# Patient Record
Sex: Female | Born: 1960 | Race: White | Hispanic: Refuse to answer | Marital: Married | Smoking: Former smoker
Health system: Southern US, Community
[De-identification: ages and names within clinical notes are randomized; demographics above are authoritative.]

## PROBLEM LIST (undated history)

## (undated) DIAGNOSIS — M8580 Other specified disorders of bone density and structure, unspecified site: Secondary | ICD-10-CM

## (undated) DIAGNOSIS — T8859XA Other complications of anesthesia, initial encounter: Secondary | ICD-10-CM

## (undated) DIAGNOSIS — F419 Anxiety disorder, unspecified: Secondary | ICD-10-CM

## (undated) DIAGNOSIS — IMO0001 Reserved for inherently not codable concepts without codable children: Secondary | ICD-10-CM

## (undated) DIAGNOSIS — L4050 Arthropathic psoriasis, unspecified: Secondary | ICD-10-CM

## (undated) DIAGNOSIS — M069 Rheumatoid arthritis, unspecified: Secondary | ICD-10-CM

## (undated) DIAGNOSIS — R200 Anesthesia of skin: Secondary | ICD-10-CM

## (undated) DIAGNOSIS — Z9889 Other specified postprocedural states: Secondary | ICD-10-CM

## (undated) DIAGNOSIS — K219 Gastro-esophageal reflux disease without esophagitis: Secondary | ICD-10-CM

## (undated) DIAGNOSIS — I38 Endocarditis, valve unspecified: Secondary | ICD-10-CM

## (undated) DIAGNOSIS — T4145XA Adverse effect of unspecified anesthetic, initial encounter: Secondary | ICD-10-CM

## (undated) DIAGNOSIS — R3915 Urgency of urination: Secondary | ICD-10-CM

## (undated) DIAGNOSIS — I10 Essential (primary) hypertension: Secondary | ICD-10-CM

## (undated) DIAGNOSIS — E041 Nontoxic single thyroid nodule: Secondary | ICD-10-CM

## (undated) DIAGNOSIS — F329 Major depressive disorder, single episode, unspecified: Secondary | ICD-10-CM

## (undated) DIAGNOSIS — R112 Nausea with vomiting, unspecified: Secondary | ICD-10-CM

## (undated) DIAGNOSIS — G479 Sleep disorder, unspecified: Secondary | ICD-10-CM

## (undated) DIAGNOSIS — M797 Fibromyalgia: Secondary | ICD-10-CM

## (undated) DIAGNOSIS — M48061 Spinal stenosis, lumbar region without neurogenic claudication: Secondary | ICD-10-CM

## (undated) DIAGNOSIS — F32A Depression, unspecified: Secondary | ICD-10-CM

## (undated) DIAGNOSIS — L409 Psoriasis, unspecified: Secondary | ICD-10-CM

## (undated) DIAGNOSIS — M1990 Unspecified osteoarthritis, unspecified site: Secondary | ICD-10-CM

## (undated) HISTORY — PX: CHOLECYSTECTOMY: SHX55

## (undated) HISTORY — PX: FOOT SURGERY: SHX648

---

## 2008-11-12 HISTORY — PX: ABDOMINAL HYSTERECTOMY: SHX81

## 2014-10-05 ENCOUNTER — Other Ambulatory Visit: Payer: Self-pay | Admitting: Surgical

## 2014-11-01 NOTE — Patient Instructions (Addendum)
Winta Barcelo  11/01/2014   Your procedure is scheduled on: 11/10/14   Report to [facility]  Entrance and follow signs to               Short Stay Center at 8:00 AM.   Call this number if you have problems the morning of surgery [phone]   Remember:  Do not eat food or drink liquids :After Midnight.     Take these medicines the morning of surgery with A SIP OF WATER: PROTONIX                               You may not have any metal on your body including hair pins and              piercings  Do not wear jewelry, make-up, lotions, powders or perfumes.             Do not wear nail polish.  Do not shave  48 hours prior to surgery.              Men may shave face and neck.   Do not bring valuables to the hospital. [facility] IS NOT             RESPONSIBLE   FOR VALUABLES.  Contacts, dentures or bridgework may not be worn into surgery.  Leave suitcase in the car. After surgery it may be brought to your room.     Patients discharged the day of surgery will not be allowed to drive home.  Name and phone number of your driver:  Special Instructions: N/A              Please read over the following fact sheets you were given: _____________________________________________________________________                                                      - PREPARING FOR SURGERY  Before surgery, you can play an important role.  Because skin is not sterile, your skin needs to be as free of germs as possible.  You can reduce the number of germs on your skin by washing with CHG (chlorahexidine gluconate) soap before surgery.  CHG is an antiseptic cleaner which kills germs and bonds with the skin to continue killing germs even after washing. Please DO NOT use if you have an allergy to CHG or antibacterial soaps.  If your skin becomes reddened/irritated stop using the CHG and inform your nurse when you arrive at Short Stay. Do not shave (including legs and  underarms) for at least 48 hours prior to the first CHG shower.  You may shave your face. Please follow these instructions carefully:   1.  Shower with CHG Soap the night before surgery and the  morning of Surgery.   2.  If you choose to wash your hair, wash your hair first as usual with your  normal  Shampoo.   3.  After you shampoo, rinse your hair and body thoroughly to remove the  shampoo.  4.  Use CHG as you would any other liquid soap.  You can apply chg directly  to the skin and wash . Gently wash with scrungie or clean wascloth    5.  Apply the CHG Soap to your body ONLY FROM THE NECK DOWN.   Do not use on open                           Wound or open sores. Avoid contact with eyes, ears mouth and genitals (private parts).                        Genitals (private parts) with your normal soap.              6.  Wash thoroughly, paying special attention to the area where your surgery  will be performed.   7.  Thoroughly rinse your body with warm water from the neck down.   8.  DO NOT shower/wash with your normal soap after using and rinsing off  the CHG Soap .                9.  Pat yourself dry with a clean towel.             10.  Wear clean pajamas.             11.  Place clean sheets on your bed the night of your first shower and do not  sleep with pets.  Day of Surgery : Do not apply any lotions/deodorants the morning of surgery.  Please wear clean clothes to the hospital/surgery center.  FAILURE TO FOLLOW THESE INSTRUCTIONS MAY RESULT IN THE CANCELLATION OF YOUR SURGERY    PATIENT SIGNATURE_________________________________  ______________________________________________________________________     Adam Phenix  An incentive spirometer is a tool that can help keep your lungs clear and active. This tool measures how well you are filling your lungs with each breath. Taking long deep breaths may help reverse or decrease  the chance of developing breathing (pulmonary) problems (especially infection) following:  A long period of time when you are unable to move or be active. BEFORE THE PROCEDURE   If the spirometer includes an indicator to show your best effort, your nurse or respiratory therapist will set it to a desired goal.  If possible, sit up straight or lean slightly forward. Try not to slouch.  Hold the incentive spirometer in an upright position. INSTRUCTIONS FOR USE   Sit on the edge of your bed if possible, or sit up as far as you can in bed or on a chair.  Hold the incentive spirometer in an upright position.  Breathe out normally.  Place the mouthpiece in your mouth and seal your lips tightly around it.  Breathe in slowly and as deeply as possible, raising the piston or the ball toward the top of the column.  Hold your breath for 3-5 seconds or for as long as possible. Allow the piston or ball to fall to the bottom of the column.  Remove the mouthpiece from your mouth and breathe out normally.  Rest for a few seconds and repeat Steps 1 through 7 at least 10 times every 1-2 hours when you are awake. Take your time and take a few normal breaths between deep breaths.  The spirometer may include an indicator to show your best effort. Use the indicator as a goal to work toward during  each repetition.  After each set of 10 deep breaths, practice coughing to be sure your lungs are clear. If you have an incision (the cut made at the time of surgery), support your incision when coughing by placing a pillow or rolled up towels firmly against it. Once you are able to get out of bed, walk around indoors and cough well. You may stop using the incentive spirometer when instructed by your caregiver.  RISKS AND COMPLICATIONS  Take your time so you do not get dizzy or light-headed.  If you are in pain, you may need to take or ask for pain medication before doing incentive spirometry. It is harder to  take a deep breath if you are having pain. AFTER USE  Rest and breathe slowly and easily.  It can be helpful to keep track of a log of your progress. Your caregiver can provide you with a simple table to help with this. If you are using the spirometer at home, follow these instructions: SEEK MEDICAL CARE IF:   You are having difficultly using the spirometer.  You have trouble using the spirometer as often as instructed.  Your pain medication is not giving enough relief while using the spirometer.  You develop fever of 100.5 F (38.1 C) or higher. SEEK IMMEDIATE MEDICAL CARE IF:   You cough up bloody sputum that had not been present before.  You develop fever of 102 F (38.9 C) or greater.  You develop worsening pain at or near the incision site. MAKE SURE YOU:   Understand these instructions.  Will watch your condition.  Will get help right away if you are not doing well or get worse. Document Released: 03/11/2007 Document Revised: 01/21/2012 Document Reviewed: 05/12/2007 Exit[employer] Patient Information 2014 ExitCare, [place].   ________________________________________________________________________  WHAT IS A BLOOD TRANSFUSION? Blood Transfusion Information  A transfusion is the replacement of blood or some of its parts. Blood is made up of multiple cells which provide different functions.  Red blood cells carry oxygen and are used for blood loss replacement.  White blood cells fight against infection.  Platelets control bleeding.  Plasma helps clot blood.  Other blood products are available for specialized needs, such as hemophilia or other clotting disorders. BEFORE THE TRANSFUSION  Who gives blood for transfusions?   Healthy volunteers who are fully evaluated to make sure their blood is safe. This is blood bank blood. Transfusion therapy is the safest it has ever been in the practice of medicine. Before blood is taken from a donor, a complete history is taken to  make sure that person has no history of diseases nor engages in risky social behavior (examples are intravenous drug use or sexual activity with multiple partners). The donor's travel history is screened to minimize risk of transmitting infections, such as malaria. The donated blood is tested for signs of infectious diseases, such as HIV and hepatitis. The blood is then tested to be sure it is compatible with you in order to minimize the chance of a transfusion reaction. If you or a relative donates blood, this is often done in anticipation of surgery and is not appropriate for emergency situations. It takes many days to process the donated blood. RISKS AND COMPLICATIONS Although transfusion therapy is very safe and saves many lives, the main dangers of transfusion include:   Getting an infectious disease.  Developing a transfusion reaction. This is an allergic reaction to something in the blood you were given. Every precaution is taken to prevent  this. The decision to have a blood transfusion has been considered carefully by your caregiver before blood is given. Blood is not given unless the benefits outweigh the risks. AFTER THE TRANSFUSION  Right after receiving a blood transfusion, you will usually feel much better and more energetic. This is especially true if your red blood cells have gotten low (anemic). The transfusion raises the level of the red blood cells which carry oxygen, and this usually causes an energy increase.  The nurse administering the transfusion will monitor you carefully for complications. HOME CARE INSTRUCTIONS  No special instructions are needed after a transfusion. You may find your energy is better. Speak with your caregiver about any limitations on activity for underlying diseases you may have. SEEK MEDICAL CARE IF:   Your condition is not improving after your transfusion.  You develop redness or irritation at the intravenous (IV) site. SEEK IMMEDIATE MEDICAL CARE  IF:  Any of the following symptoms occur over the next 12 hours:  Shaking chills.  You have a temperature by mouth above 102 F (38.9 C), not controlled by medicine.  Chest, back, or muscle pain.  People around you feel you are not acting correctly or are confused.  Shortness of breath or difficulty breathing.  Dizziness and fainting.  You get a rash or develop hives.  You have a decrease in urine output.  Your urine turns a dark color or changes to pink, red, or brown. Any of the following symptoms occur over the next 10 days:  You have a temperature by mouth above 102 F (38.9 C), not controlled by medicine.  Shortness of breath.  Weakness after normal activity.  The white part of the eye turns yellow (jaundice).  You have a decrease in the amount of urine or are urinating less often.  Your urine turns a dark color or changes to pink, red, or brown. Document Released: 10/26/2000 Document Revised: 01/21/2012 Document Reviewed: 06/14/2008 [facility] Patient Information 2014 [place], [place].  _______________________________________________________________________

## 2014-11-02 ENCOUNTER — Ambulatory Visit (HOSPITAL_COMMUNITY)
Admission: RE | Admit: 2014-11-02 | Discharge: 2014-11-02 | Disposition: A | Discharge status: Home / Self Care | Payer: Worker's Compensation | Source: Ambulatory Visit | Attending: Surgical | Admitting: Surgical

## 2014-11-02 ENCOUNTER — Encounter (HOSPITAL_COMMUNITY): Payer: Self-pay

## 2014-11-02 ENCOUNTER — Encounter (HOSPITAL_COMMUNITY)
Admission: RE | Admit: 2014-11-02 | Discharge: 2014-11-02 | Disposition: A | Discharge status: Home / Self Care | Payer: Worker's Compensation | Source: Ambulatory Visit | Attending: Orthopedic Surgery | Admitting: Orthopedic Surgery

## 2014-11-02 DIAGNOSIS — M5137 Other intervertebral disc degeneration, lumbosacral region: Secondary | ICD-10-CM | POA: Insufficient documentation

## 2014-11-02 DIAGNOSIS — Z01818 Encounter for other preprocedural examination: Secondary | ICD-10-CM | POA: Insufficient documentation

## 2014-11-02 HISTORY — DX: Adverse effect of unspecified anesthetic, initial encounter: T41.45XA

## 2014-11-02 HISTORY — DX: Anxiety disorder, unspecified: F41.9

## 2014-11-02 HISTORY — DX: Endocarditis, valve unspecified: I38

## 2014-11-02 HISTORY — DX: Reserved for inherently not codable concepts without codable children: IMO0001

## 2014-11-02 HISTORY — DX: Urgency of urination: R39.15

## 2014-11-02 HISTORY — DX: Other complications of anesthesia, initial encounter: T88.59XA

## 2014-11-02 HISTORY — DX: Arthropathic psoriasis, unspecified: L40.50

## 2014-11-02 HISTORY — DX: Depression, unspecified: F32.A

## 2014-11-02 HISTORY — DX: Spinal stenosis, lumbar region without neurogenic claudication: M48.061

## 2014-11-02 HISTORY — DX: Unspecified osteoarthritis, unspecified site: M19.90

## 2014-11-02 HISTORY — DX: Sleep disorder, unspecified: G47.9

## 2014-11-02 HISTORY — DX: Psoriasis, unspecified: L40.9

## 2014-11-02 HISTORY — DX: Anesthesia of skin: R20.0

## 2014-11-02 HISTORY — DX: Major depressive disorder, single episode, unspecified: F32.9

## 2014-11-02 HISTORY — DX: Nontoxic single thyroid nodule: E04.1

## 2014-11-02 HISTORY — DX: Rheumatoid arthritis, unspecified: M06.9

## 2014-11-02 HISTORY — DX: Fibromyalgia: M79.7

## 2014-11-02 HISTORY — DX: Other specified postprocedural states: Z98.890

## 2014-11-02 HISTORY — DX: Other specified disorders of bone density and structure, unspecified site: M85.80

## 2014-11-02 HISTORY — DX: Gastro-esophageal reflux disease without esophagitis: K21.9

## 2014-11-02 HISTORY — DX: Nausea with vomiting, unspecified: R11.2

## 2014-11-02 HISTORY — DX: Essential (primary) hypertension: I10

## 2014-11-02 LAB — CBC WITH DIFFERENTIAL/PLATELET
Basophils Absolute: 0 10*3/uL (ref 0.0–0.1)
Basophils Relative: 0 % (ref 0–1)
Eosinophils Absolute: 0.5 10*3/uL (ref 0.0–0.7)
Eosinophils Relative: 5 % (ref 0–5)
HCT: 48 % — ABNORMAL HIGH (ref 36.0–46.0)
Hemoglobin: 16 g/dL — ABNORMAL HIGH (ref 12.0–15.0)
Lymphocytes Relative: 27 % (ref 12–46)
Lymphs Abs: 2.8 10*3/uL (ref 0.7–4.0)
MCH: 29 pg (ref 26.0–34.0)
MCHC: 33.3 g/dL (ref 30.0–36.0)
MCV: 87.1 fL (ref 78.0–100.0)
Monocytes Absolute: 0.8 10*3/uL (ref 0.1–1.0)
Monocytes Relative: 8 % (ref 3–12)
Neutro Abs: 6.1 10*3/uL (ref 1.7–7.7)
Neutrophils Relative %: 60 % (ref 43–77)
Platelets: 244 10*3/uL (ref 150–400)
RBC: 5.51 MIL/uL — ABNORMAL HIGH (ref 3.87–5.11)
RDW: 14.2 % (ref 11.5–15.5)
WBC: 10.3 10*3/uL (ref 4.0–10.5)

## 2014-11-02 LAB — APTT: aPTT: 31 seconds (ref 24–37)

## 2014-11-02 LAB — COMPREHENSIVE METABOLIC PANEL
ALT: 64 U/L — ABNORMAL HIGH (ref 0–35)
AST: 39 U/L — ABNORMAL HIGH (ref 0–37)
Albumin: 4.5 g/dL (ref 3.5–5.2)
Alkaline Phosphatase: 67 U/L (ref 39–117)
Anion gap: 9 (ref 5–15)
BUN: 17 mg/dL (ref 6–23)
CO2: 29 mmol/L (ref 19–32)
Calcium: 10.1 mg/dL (ref 8.4–10.5)
Chloride: 105 mEq/L (ref 96–112)
Creatinine, Ser: 0.72 mg/dL (ref 0.50–1.10)
GFR calc Af Amer: >90 mL/min (ref >90)
GFR calc non Af Amer: >90 mL/min (ref >90)
Glucose, Bld: 113 mg/dL — ABNORMAL HIGH (ref 70–99)
Potassium: 4.8 mmol/L (ref 3.5–5.1)
Sodium: 143 mmol/L (ref 135–145)
Total Bilirubin: 0.7 mg/dL (ref 0.3–1.2)
Total Protein: 7.3 g/dL (ref 6.0–8.3)

## 2014-11-02 LAB — URINALYSIS, ROUTINE W REFLEX MICROSCOPIC
Bilirubin Urine: NEGATIVE
Glucose, UA: NEGATIVE mg/dL
Hgb urine dipstick: NEGATIVE
Ketones, ur: NEGATIVE mg/dL
Leukocytes, UA: NEGATIVE
Nitrite: NEGATIVE
Protein, ur: NEGATIVE mg/dL
Specific Gravity, Urine: 1.019 (ref 1.005–1.030)
Urobilinogen, UA: 0.2 mg/dL (ref 0.0–1.0)
pH: 5.5 (ref 5.0–8.0)

## 2014-11-02 LAB — PROTIME-INR
INR: 1.04 (ref 0.00–1.49)
Prothrombin Time: 13.7 seconds (ref 11.6–15.2)

## 2014-11-02 LAB — ABO/RH: ABO/RH(D): O NEG

## 2014-11-02 LAB — SURGICAL PCR SCREEN
MRSA, PCR: NEGATIVE
STAPHYLOCOCCUS AUREUS: NEGATIVE

## 2014-11-02 IMAGING — CR DG LUMBAR SPINE 2-3V
2 series · 2 of 2 positions shown · non-contrast
Comparison: None.

CLINICAL DATA: Preoperative exam prior to back surgery on [DATE].

EXAM:
LUMBAR SPINE - 2-3 VIEW

[t l-spine a.p.]
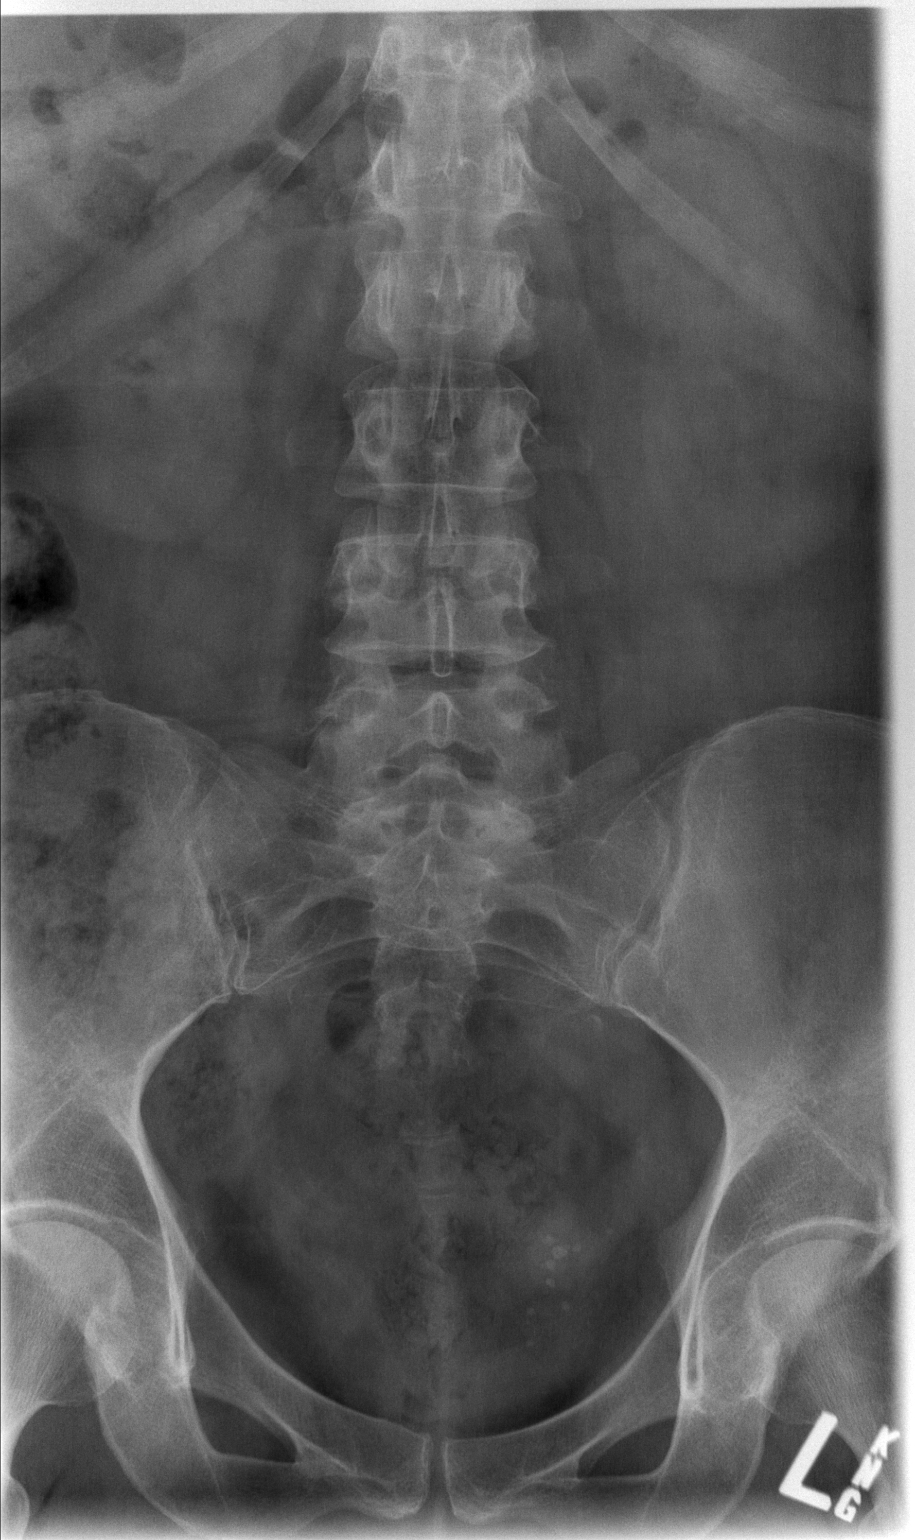

[t l-spine lat]
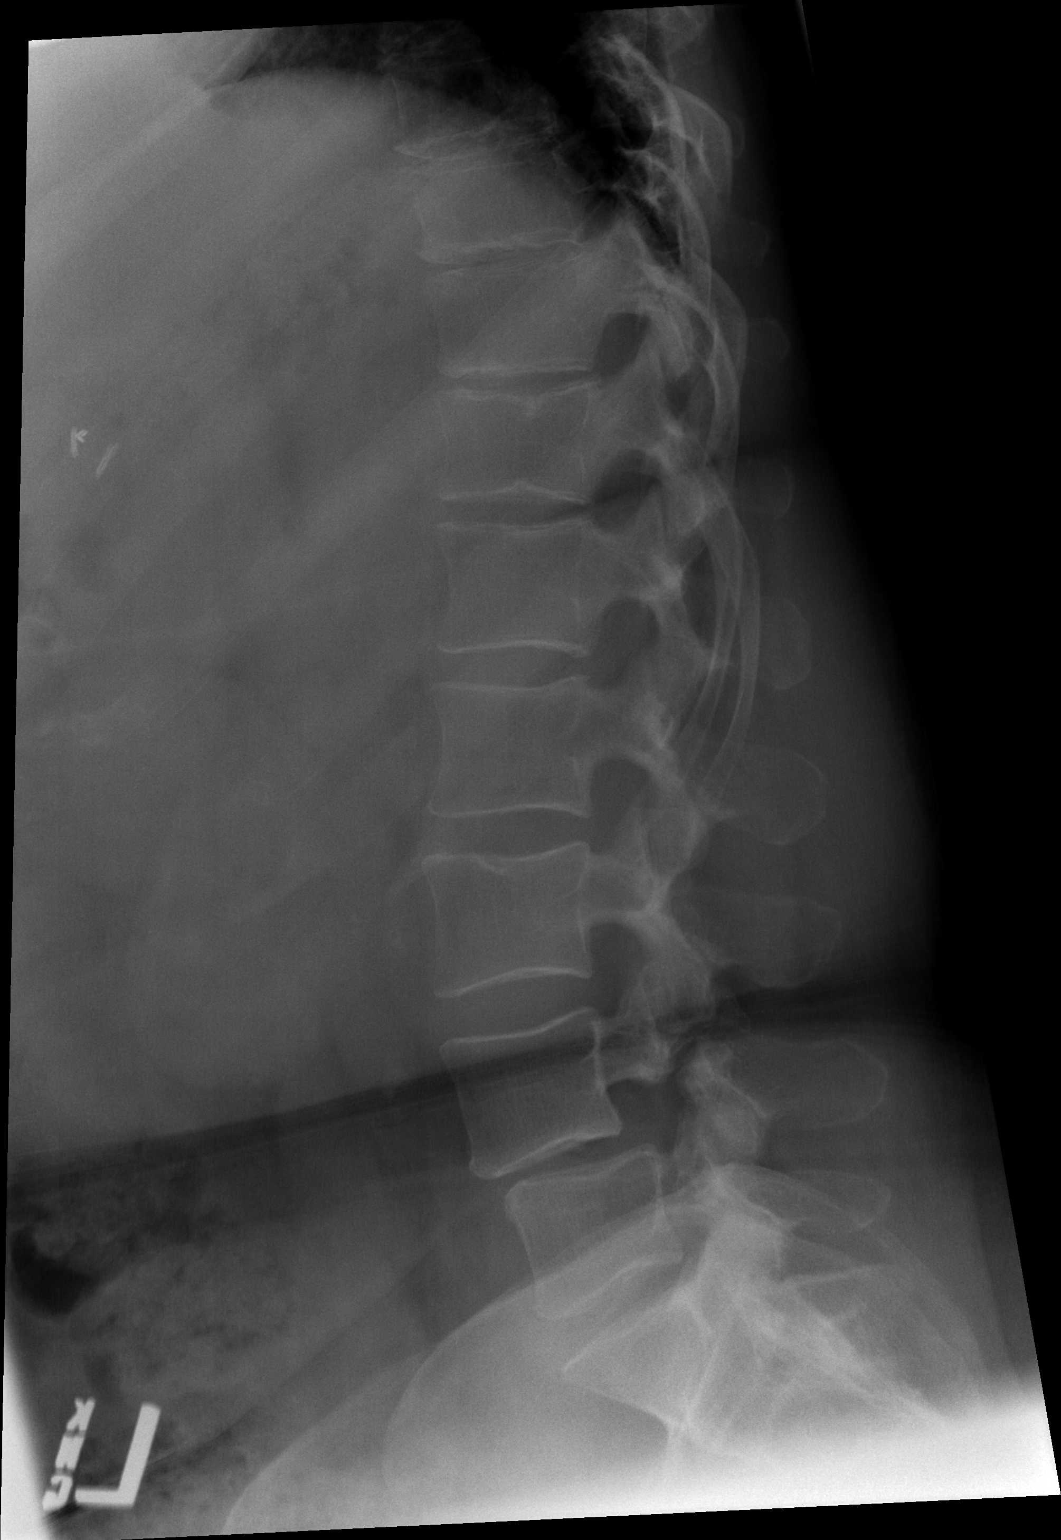

[2 of 2 positions shown; findings below may reference images not displayed]

FINDINGS: The lumbar vertebral bodies are preserved in height. There are
bilateral pars defects at L4. There is grade 1 anterolisthesis of L4
with respect L5. There is moderate disc space narrowing at L4-5 as
well. There is mild facet joint hypertrophy at L5-S1. The pedicles
and transverse processes are intact. The observed portions of the
sacrum are normal.
IMPRESSION: There are bilateral pars defects at L4 with grade 1 anterolisthesis
of L4 with respect L5. There is degenerative disc space narrowing at
L4-5 and bilateral facet joint hypertrophy at L5-S1.

## 2014-11-02 IMAGING — CR DG CHEST 2V
2 series · 2 of 2 positions shown · non-contrast
Comparison: None.

CLINICAL DATA: Preop.  Back surgery.

EXAM:
CHEST  2 VIEW

[w chest pa]
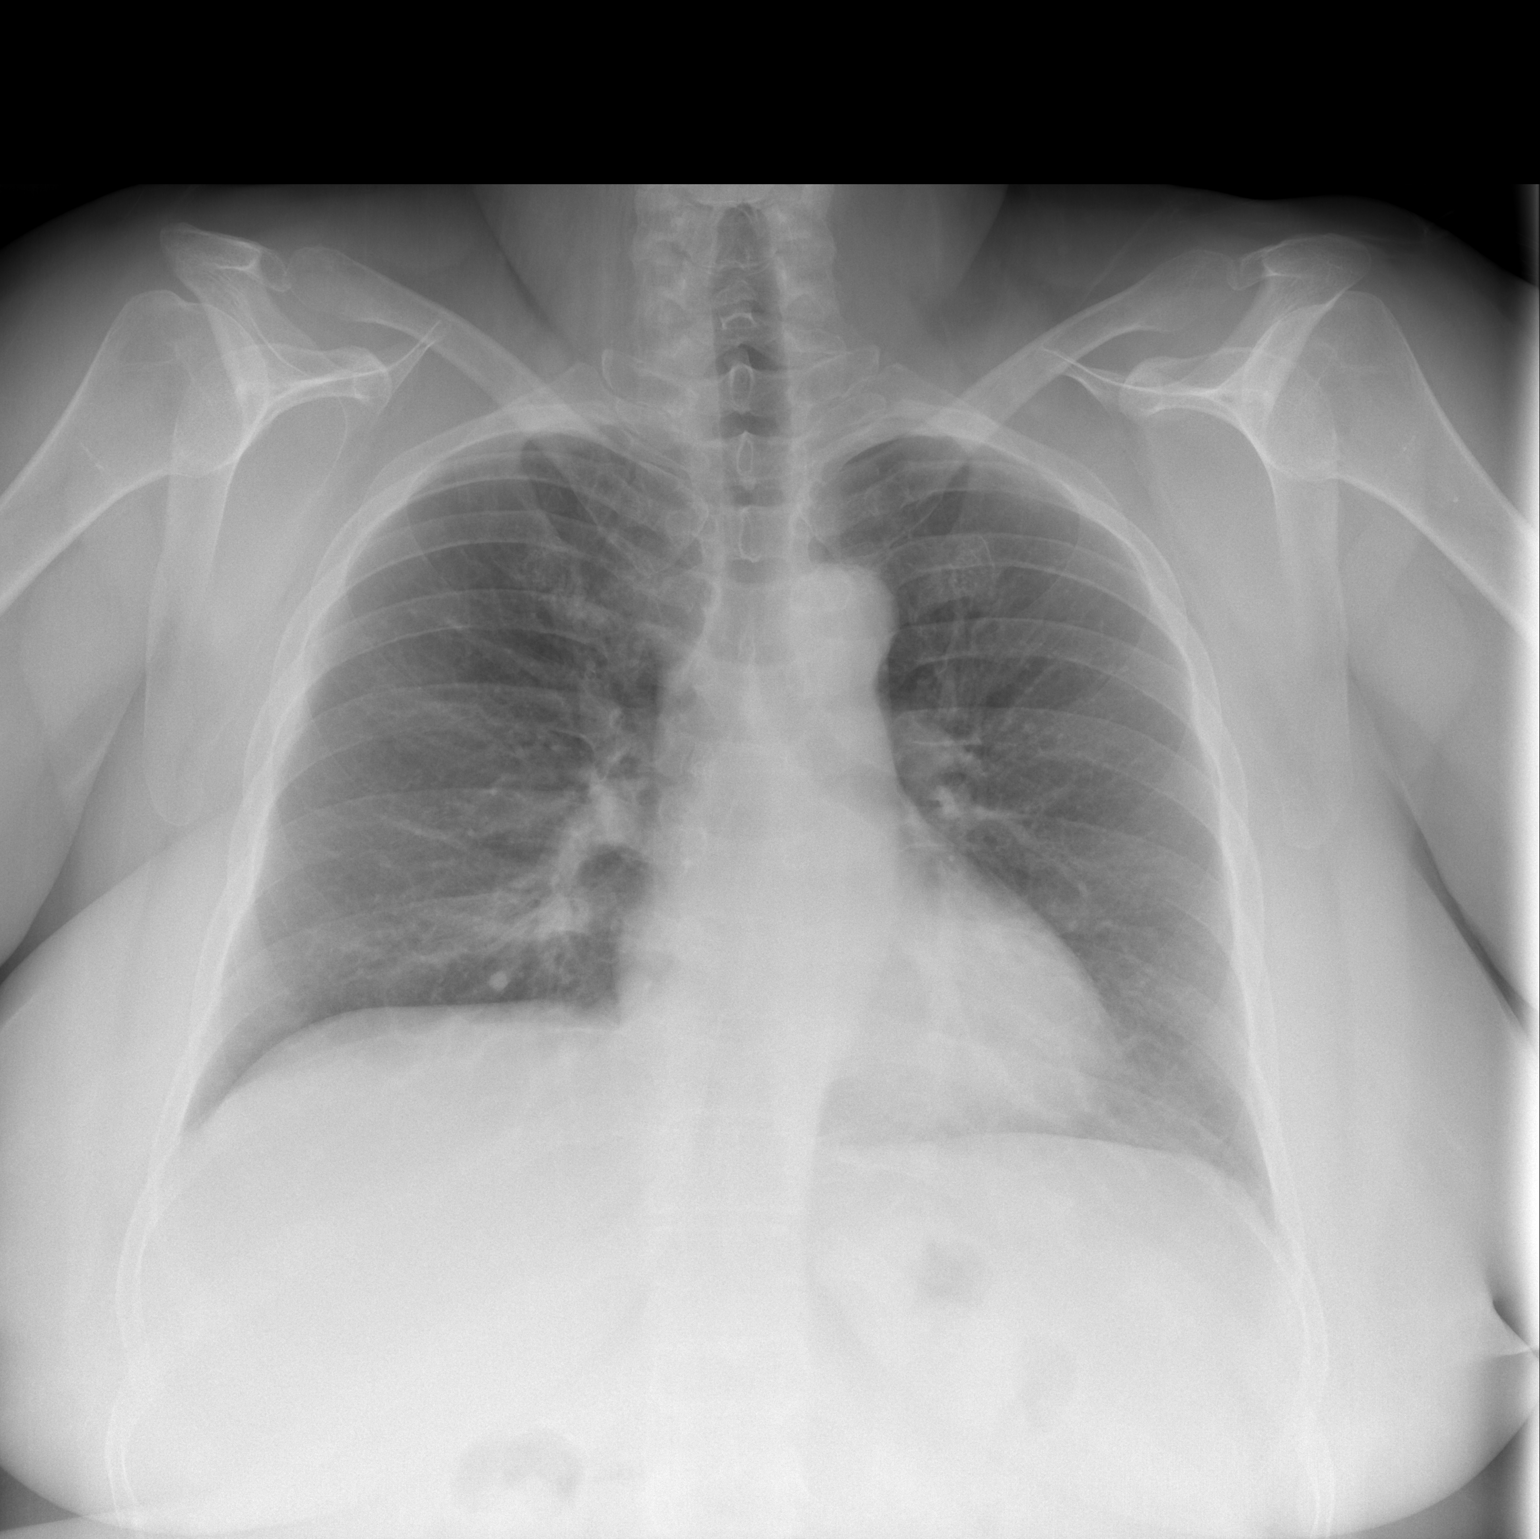

[w chest lat]
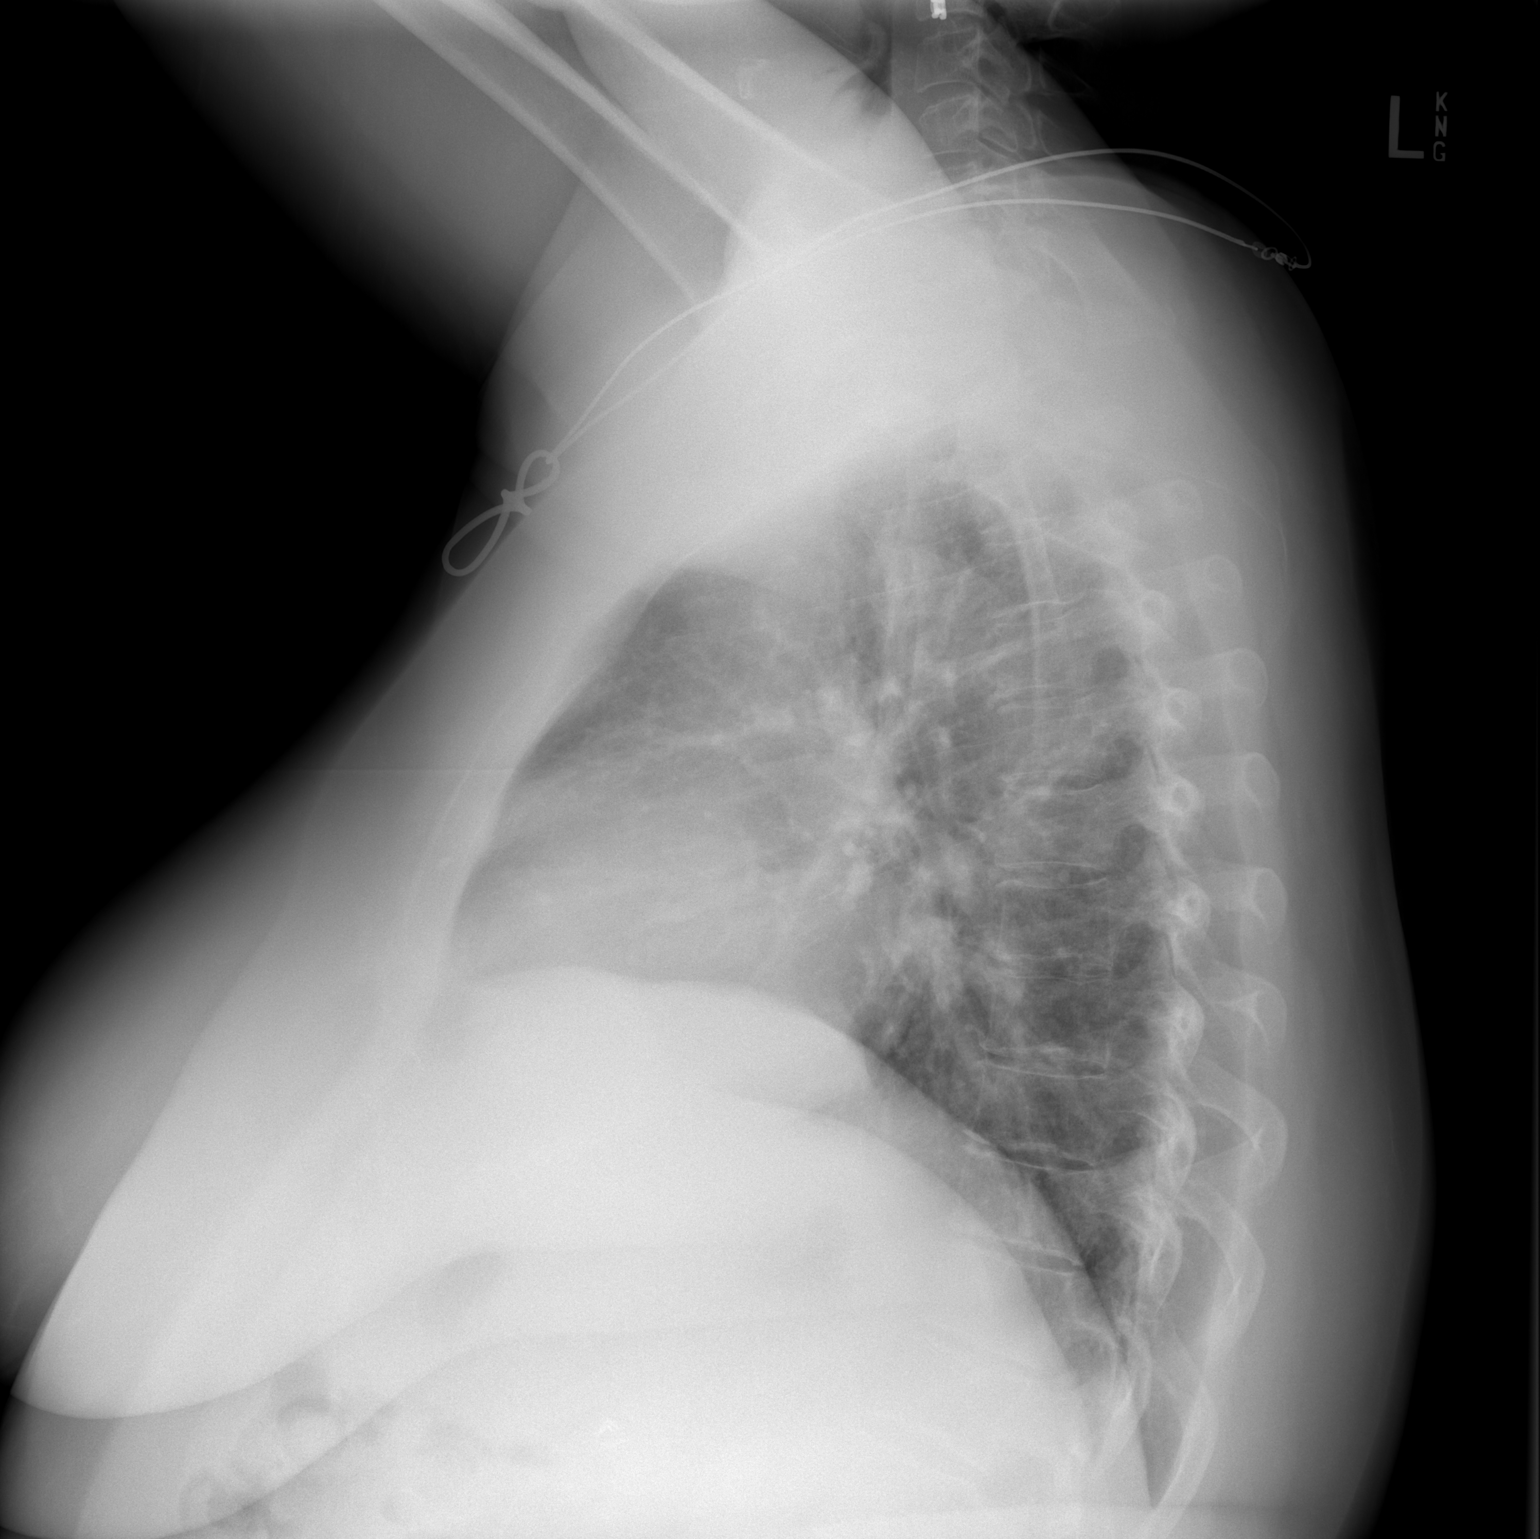

[2 of 2 positions shown; findings below may reference images not displayed]

FINDINGS: Low volumes. Grossly clear. Normal heart size. No pneumothorax. No
pleural effusion.
IMPRESSION: No active cardiopulmonary disease.

## 2014-11-02 NOTE — Progress Notes (Signed)
   11/02/14 1432  OBSTRUCTIVE SLEEP APNEA  Have you ever been diagnosed with sleep apnea through a sleep study? No  Do you snore loudly (loud enough to be heard through closed doors)?  1  Do you often feel tired, fatigued, or sleepy during the daytime? 1  Has anyone observed you stop breathing during your sleep? 0  Do you have, or are you being treated for high blood pressure? 1  BMI more than 35 kg/m2? 1  Age over 51 years old? 1  Neck circumference greater than 40 cm/16 inches? 0  Gender: 0  Obstructive Sleep Apnea Score 5  Score 4 or greater  Results sent to PCP

## 2014-11-10 ENCOUNTER — Inpatient Hospital Stay (HOSPITAL_COMMUNITY): Payer: Worker's Compensation

## 2014-11-10 ENCOUNTER — Encounter (HOSPITAL_COMMUNITY): Payer: Self-pay | Admitting: *Deleted

## 2014-11-10 ENCOUNTER — Inpatient Hospital Stay (HOSPITAL_COMMUNITY): Payer: Worker's Compensation | Admitting: Certified Registered Nurse Anesthetist

## 2014-11-10 ENCOUNTER — Observation Stay (HOSPITAL_COMMUNITY)
Admission: RE | Admit: 2014-11-10 | Discharge: 2014-11-11 | Disposition: A | Discharge status: Home / Self Care | Payer: Worker's Compensation | Source: Ambulatory Visit | Attending: Orthopedic Surgery | Admitting: Orthopedic Surgery

## 2014-11-10 ENCOUNTER — Encounter (HOSPITAL_COMMUNITY)
Admission: RE | Disposition: A | Discharge status: Home / Self Care | Payer: Self-pay | Source: Ambulatory Visit | Attending: Orthopedic Surgery

## 2014-11-10 DIAGNOSIS — M7138 Other bursal cyst, other site: Secondary | ICD-10-CM | POA: Insufficient documentation

## 2014-11-10 DIAGNOSIS — Z885 Allergy status to narcotic agent status: Secondary | ICD-10-CM | POA: Insufficient documentation

## 2014-11-10 DIAGNOSIS — M4806 Spinal stenosis, lumbar region: Secondary | ICD-10-CM | POA: Diagnosis not present

## 2014-11-10 DIAGNOSIS — I10 Essential (primary) hypertension: Secondary | ICD-10-CM | POA: Diagnosis not present

## 2014-11-10 DIAGNOSIS — M5126 Other intervertebral disc displacement, lumbar region: Principal | ICD-10-CM | POA: Insufficient documentation

## 2014-11-10 DIAGNOSIS — F329 Major depressive disorder, single episode, unspecified: Secondary | ICD-10-CM | POA: Diagnosis not present

## 2014-11-10 DIAGNOSIS — F419 Anxiety disorder, unspecified: Secondary | ICD-10-CM | POA: Insufficient documentation

## 2014-11-10 DIAGNOSIS — K219 Gastro-esophageal reflux disease without esophagitis: Secondary | ICD-10-CM | POA: Diagnosis not present

## 2014-11-10 DIAGNOSIS — E041 Nontoxic single thyroid nodule: Secondary | ICD-10-CM | POA: Diagnosis not present

## 2014-11-10 DIAGNOSIS — Z79899 Other long term (current) drug therapy: Secondary | ICD-10-CM | POA: Insufficient documentation

## 2014-11-10 DIAGNOSIS — L4050 Arthropathic psoriasis, unspecified: Secondary | ICD-10-CM | POA: Insufficient documentation

## 2014-11-10 DIAGNOSIS — M797 Fibromyalgia: Secondary | ICD-10-CM | POA: Insufficient documentation

## 2014-11-10 DIAGNOSIS — M069 Rheumatoid arthritis, unspecified: Secondary | ICD-10-CM | POA: Diagnosis not present

## 2014-11-10 DIAGNOSIS — L409 Psoriasis, unspecified: Secondary | ICD-10-CM | POA: Diagnosis not present

## 2014-11-10 DIAGNOSIS — Z419 Encounter for procedure for purposes other than remedying health state, unspecified: Secondary | ICD-10-CM

## 2014-11-10 DIAGNOSIS — M48062 Spinal stenosis, lumbar region with neurogenic claudication: Secondary | ICD-10-CM | POA: Diagnosis present

## 2014-11-10 HISTORY — PX: DECOMPRESSIVE LUMBAR LAMINECTOMY LEVEL 1: SHX5791

## 2014-11-10 LAB — TYPE AND SCREEN
ABO/RH(D): O NEG
Antibody Screen: NEGATIVE

## 2014-11-10 IMAGING — DX DG SPINE 1V PORT
1 series · 1 of 1 positions shown · non-contrast
Comparison: Intraoperative image earlier today.

CLINICAL DATA: Surgical level L4-5.

EXAM:
PORTABLE SPINE - 1 VIEW

[l-spine x-table]
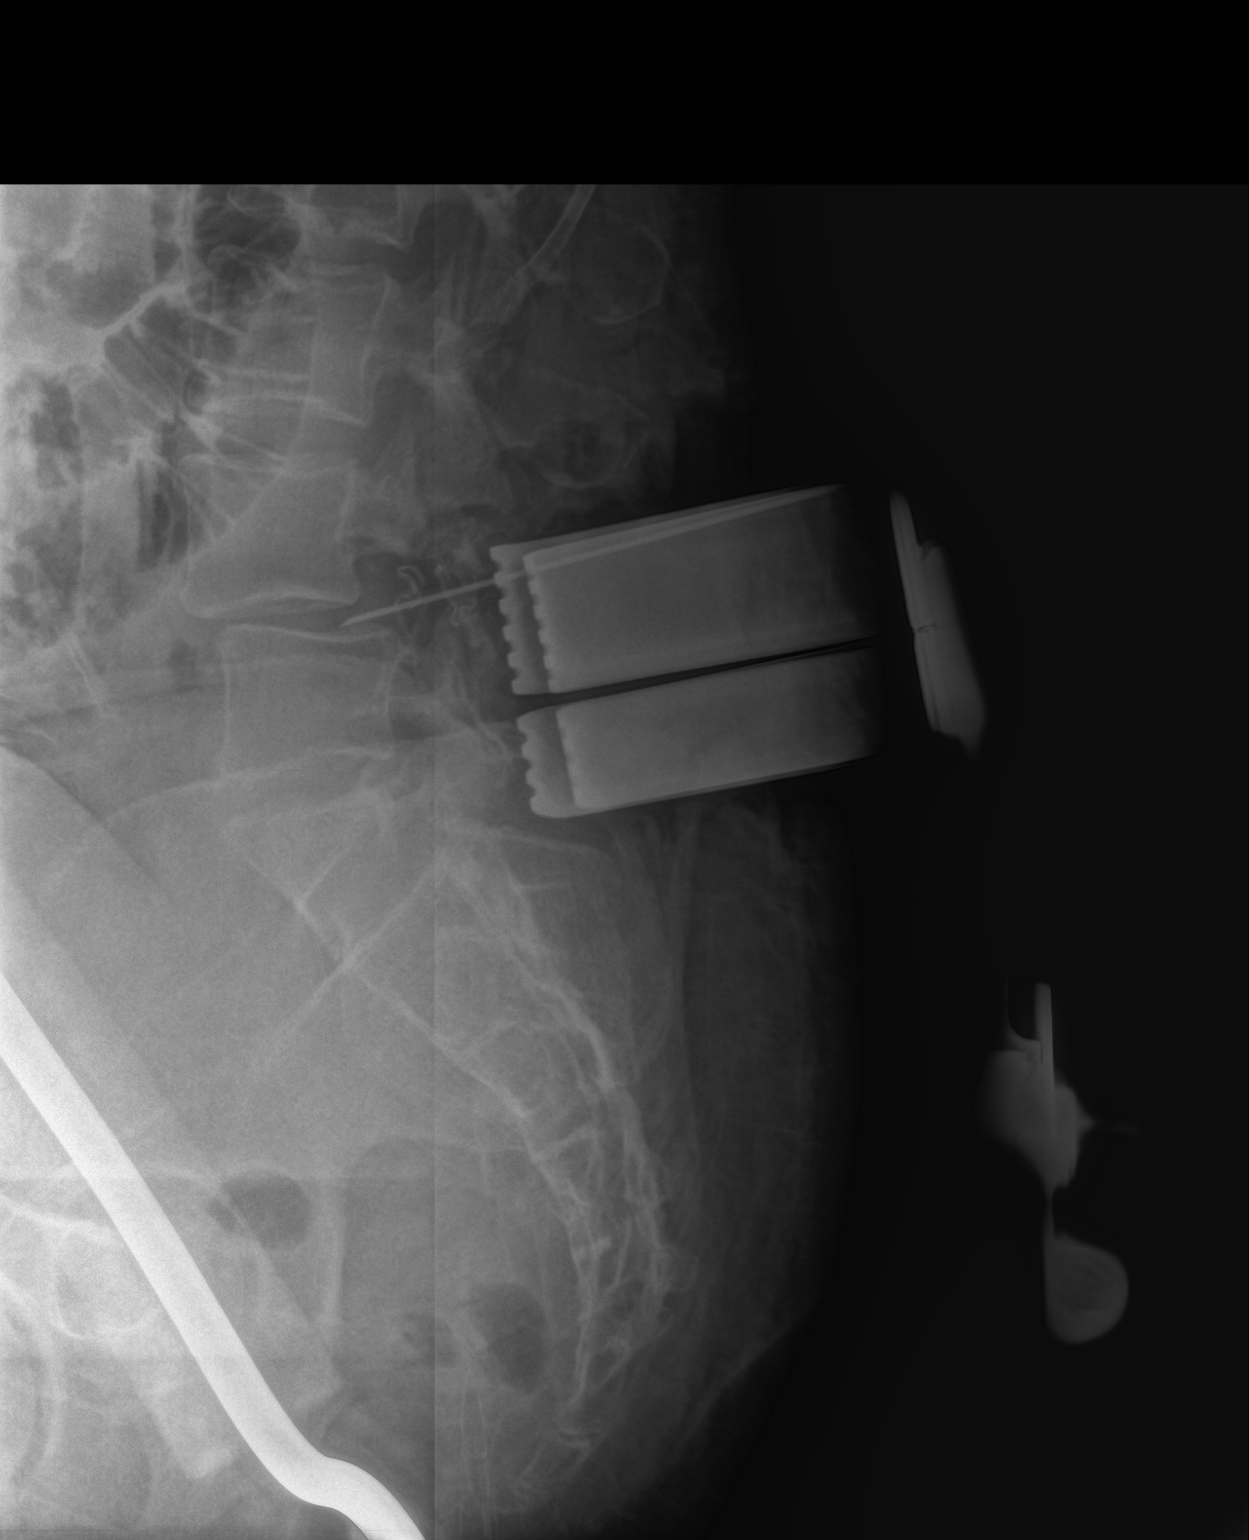

[1 of 1 positions shown; findings below may reference images not displayed]

FINDINGS: Posterior surgical instruments are in place with a localizing
instrument projecting over the L4-5 disc space. Stable mild
anterolisthesis of L4 on L5.
IMPRESSION: Localization of L4-5.

## 2014-11-10 IMAGING — DX DG SPINE 1V PORT
1 series · 1 of 1 positions shown · non-contrast
Comparison: Same day.

CLINICAL DATA: Surgical localization.

EXAM:
PORTABLE SPINE - 1 VIEW

[l-spine x-table]
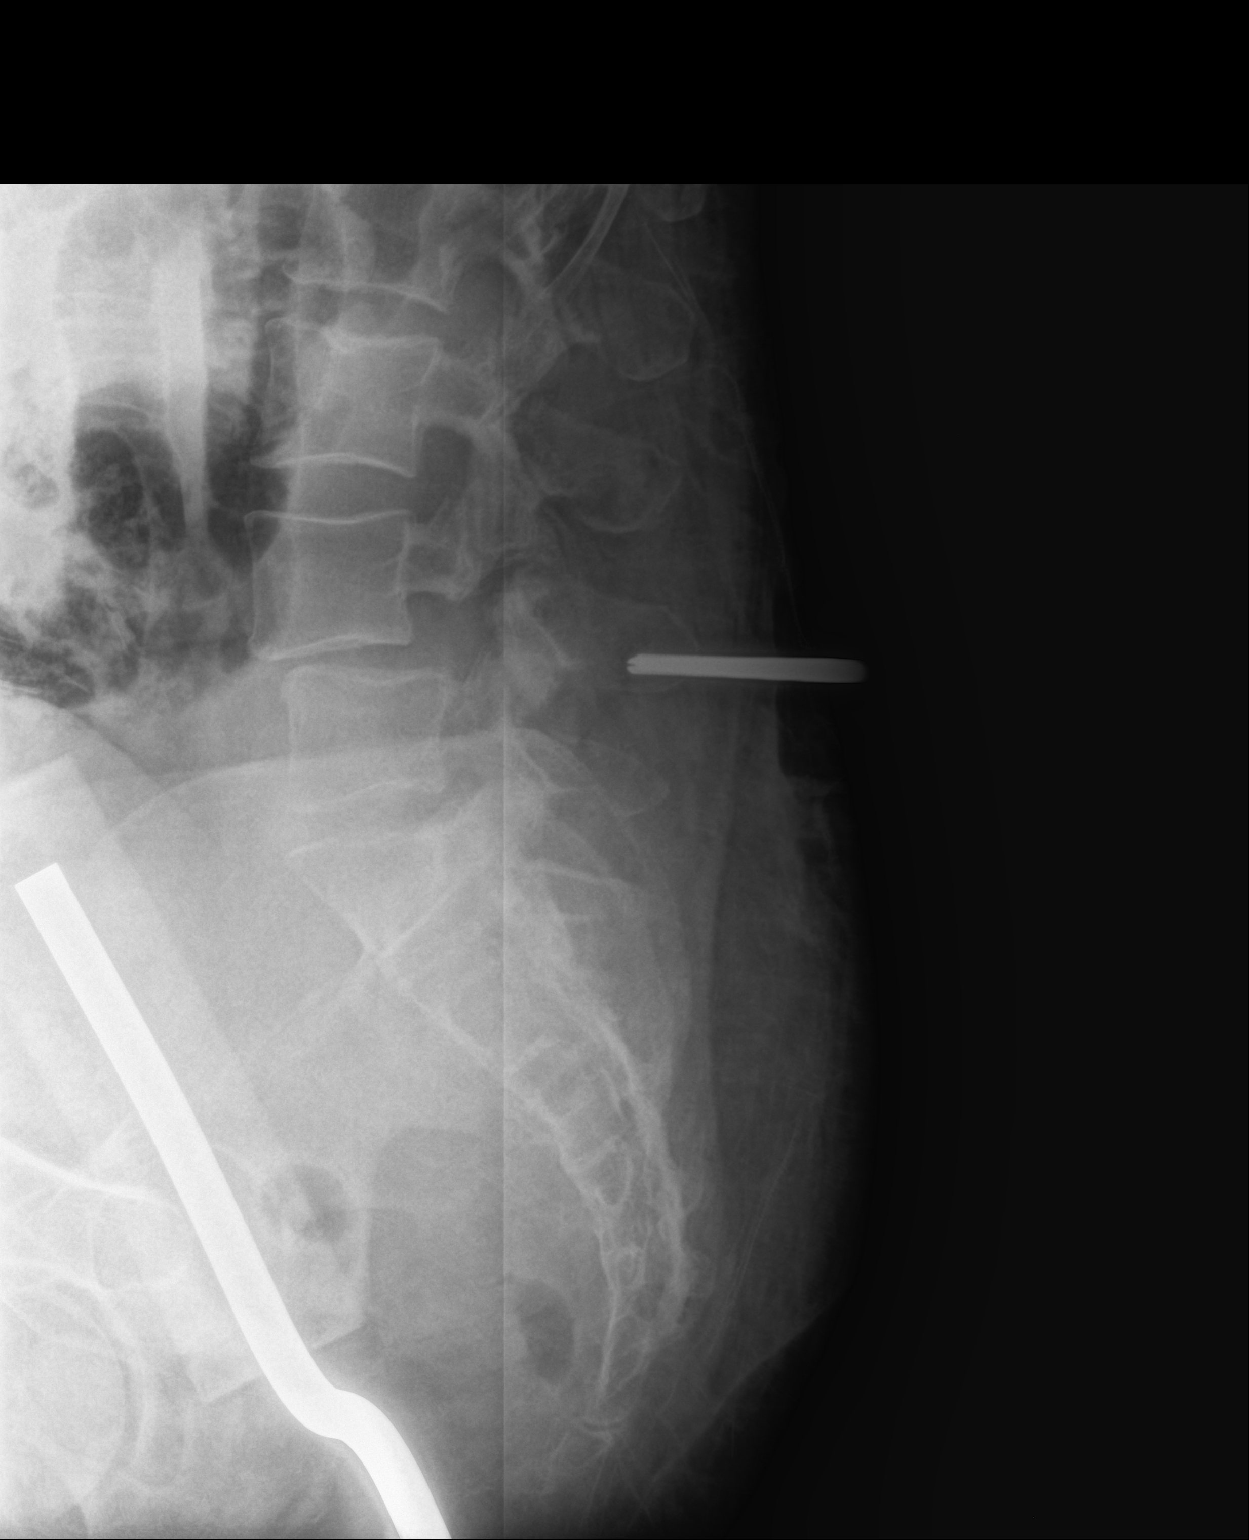

[1 of 1 positions shown; findings below may reference images not displayed]

FINDINGS: Single lateral intraoperative radiograph of lumbar spine was
obtained. Grade 1 anterolisthesis of L4-5 secondary to pars defects
of L4 is again noted. Surgical probe is seen overlying the posterior
spinous process of L4.
IMPRESSION: Surgical localization as described above.

## 2014-11-10 IMAGING — DX DG SPINE 1V PORT
1 series · 1 of 1 positions shown · non-contrast
Comparison: [DATE].

CLINICAL DATA: Surgical localization.

EXAM:
PORTABLE SPINE - 1 VIEW

[l-spine x-table]
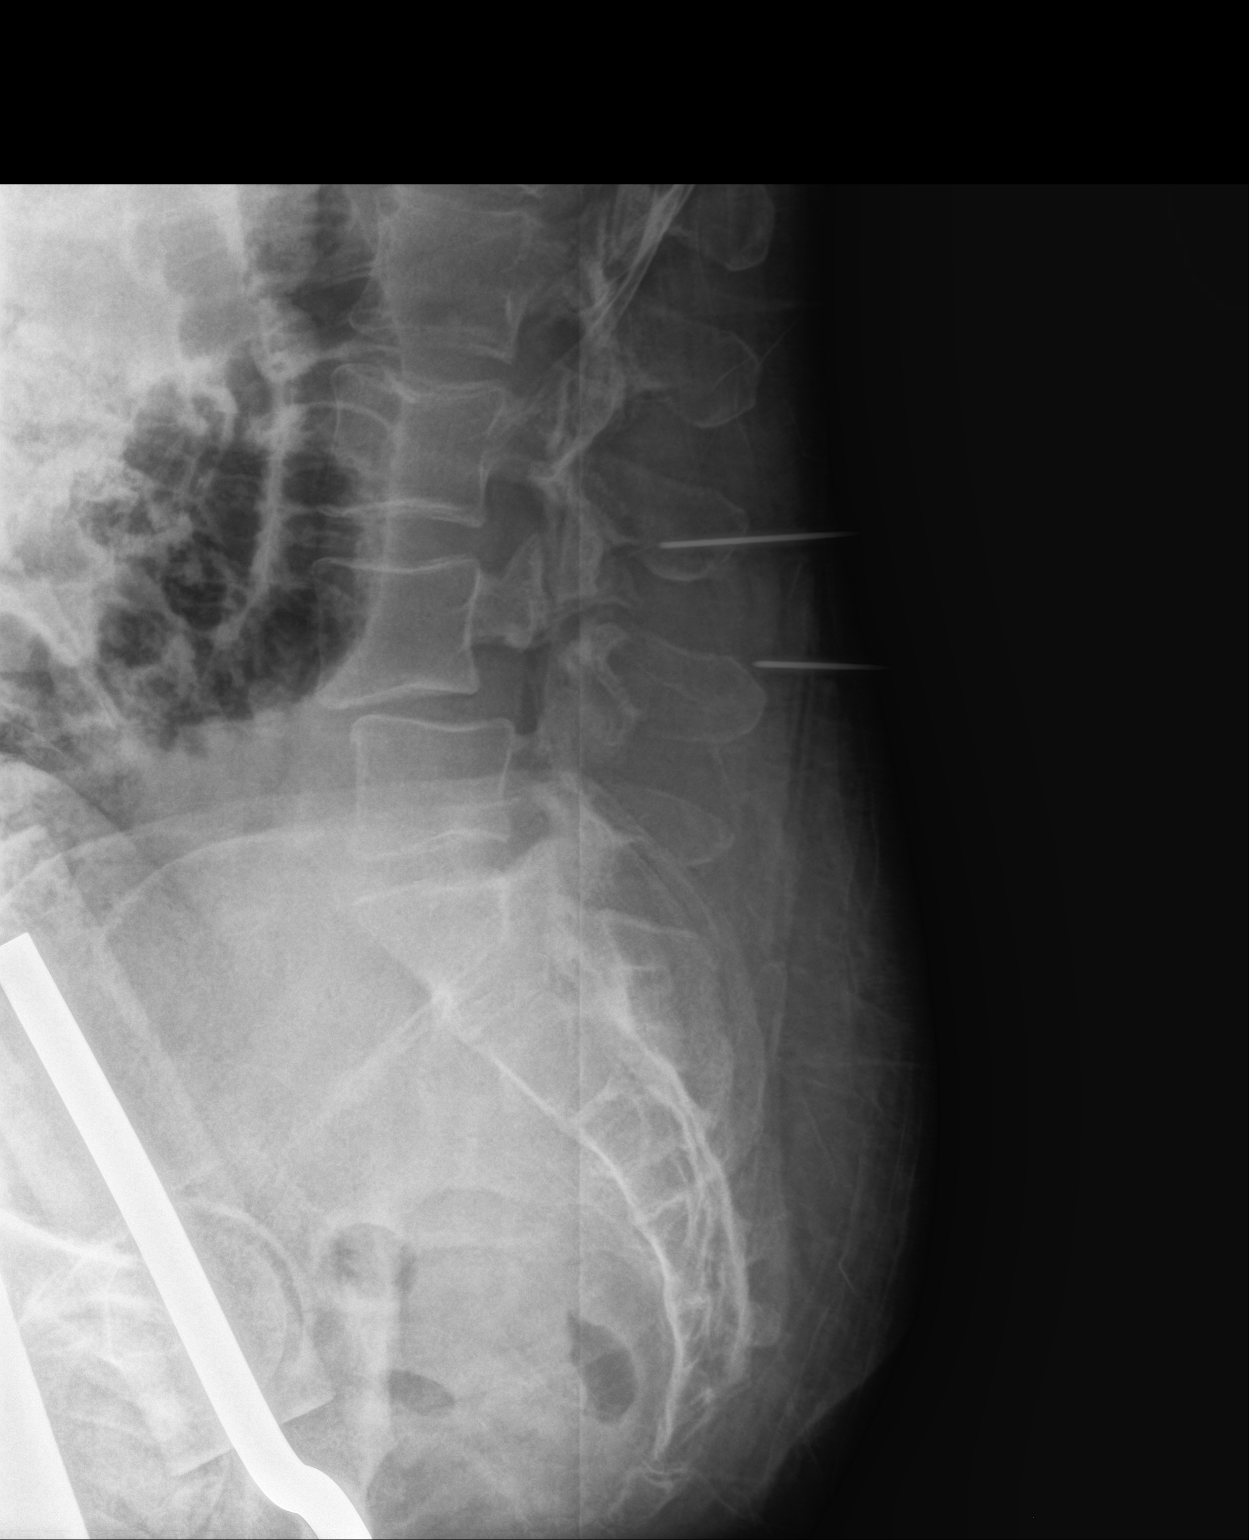

[1 of 1 positions shown; findings below may reference images not displayed]

FINDINGS: Single lateral intraoperative projection of the lumbar spine was
submitted for review. Pars defects are seen at L4 with grade 1
anterolisthesis at L4-5. Surgical probes are seen overlying or
adjacent to the posterior spinous processes of L3 and L4.
IMPRESSION: Surgical localization as described above.

## 2014-11-10 SURGERY — DECOMPRESSIVE LUMBAR LAMINECTOMY LEVEL 1
Anesthesia: General | Site: Back | Laterality: Right

## 2014-11-10 MED ORDER — FENTANYL CITRATE 0.05 MG/ML IJ SOLN
INTRAMUSCULAR | Status: AC
  Filled 2014-11-10: qty 5

## 2014-11-10 MED ORDER — HYDROMORPHONE HCL 1 MG/ML IJ SOLN
INTRAMUSCULAR | Status: AC
  Filled 2014-11-10: qty 1

## 2014-11-10 MED ORDER — BUPIVACAINE-EPINEPHRINE (PF) 0.5% -1:200000 IJ SOLN
INTRAMUSCULAR | Status: AC
  Filled 2014-11-10: qty 30

## 2014-11-10 MED ORDER — LACTATED RINGERS IV SOLN: INTRAVENOUS | Status: DC

## 2014-11-10 MED ORDER — PHENOL 1.4 % MT LIQD: 1.0000 | OROMUCOSAL | Status: DC | PRN

## 2014-11-10 MED ORDER — METHOCARBAMOL 1000 MG/10ML IJ SOLN
500.0000 mg | Freq: Four times a day (QID) | INTRAVENOUS | Status: DC | PRN
  Administered 2014-11-10: 500 mg via INTRAVENOUS
  Filled 2014-11-10 (×2): qty 5

## 2014-11-10 MED ORDER — EPHEDRINE SULFATE 50 MG/ML IJ SOLN
INTRAMUSCULAR | Status: DC | PRN
  Administered 2014-11-10: 5 mg via INTRAVENOUS

## 2014-11-10 MED ORDER — KETOROLAC TROMETHAMINE 30 MG/ML IJ SOLN
30.0000 mg | Freq: Once | INTRAMUSCULAR | Status: AC
  Administered 2014-11-10: 30 mg via INTRAVENOUS

## 2014-11-10 MED ORDER — ONDANSETRON HCL 4 MG/2ML IJ SOLN
4.0000 mg | Freq: Once | INTRAMUSCULAR | Status: AC
  Administered 2014-11-10: 4 mg via INTRAVENOUS

## 2014-11-10 MED ORDER — LISINOPRIL 20 MG PO TABS
20.0000 mg | ORAL_TABLET | Freq: Every day | ORAL | Status: DC
  Administered 2014-11-10: 20 mg via ORAL
  Filled 2014-11-10 (×2): qty 1

## 2014-11-10 MED ORDER — NEOSTIGMINE METHYLSULFATE 10 MG/10ML IV SOLN
INTRAVENOUS | Status: AC
  Filled 2014-11-10: qty 1

## 2014-11-10 MED ORDER — KETOROLAC TROMETHAMINE 30 MG/ML IJ SOLN
INTRAMUSCULAR | Status: AC
  Filled 2014-11-10: qty 1

## 2014-11-10 MED ORDER — PROPOFOL 10 MG/ML IV BOLUS
INTRAVENOUS | Status: AC
  Filled 2014-11-10: qty 20

## 2014-11-10 MED ORDER — CLONAZEPAM 1 MG PO TABS
1.0000 mg | ORAL_TABLET | Freq: Every day | ORAL | Status: DC
  Administered 2014-11-10: 1 mg via ORAL
  Filled 2014-11-10: qty 1

## 2014-11-10 MED ORDER — PANTOPRAZOLE SODIUM 40 MG PO TBEC
40.0000 mg | DELAYED_RELEASE_TABLET | Freq: Every day | ORAL | Status: DC
  Administered 2014-11-11: 40 mg via ORAL
  Filled 2014-11-10: qty 1

## 2014-11-10 MED ORDER — ONDANSETRON HCL 4 MG/2ML IJ SOLN
INTRAMUSCULAR | Status: DC | PRN
  Administered 2014-11-10: 4 mg via INTRAVENOUS

## 2014-11-10 MED ORDER — CEFAZOLIN SODIUM 1-5 GM-% IV SOLN
1.0000 g | Freq: Three times a day (TID) | INTRAVENOUS | Status: AC
  Administered 2014-11-10 – 2014-11-11 (×3): 1 g via INTRAVENOUS
  Filled 2014-11-10 (×3): qty 50

## 2014-11-10 MED ORDER — LACTATED RINGERS IV SOLN
INTRAVENOUS | Status: DC
  Administered 2014-11-10: 1000 mL via INTRAVENOUS
  Administered 2014-11-10: 11:00:00 via INTRAVENOUS

## 2014-11-10 MED ORDER — HYDROMORPHONE HCL 2 MG PO TABS
2.0000 mg | ORAL_TABLET | ORAL | Status: DC | PRN
  Administered 2014-11-10 – 2014-11-11 (×4): 2 mg via ORAL
  Filled 2014-11-10 (×4): qty 1

## 2014-11-10 MED ORDER — BUPIVACAINE LIPOSOME 1.3 % IJ SUSP
20.0000 mL | Freq: Once | INTRAMUSCULAR | Status: AC
  Administered 2014-11-10: 20 mL
  Filled 2014-11-10: qty 20

## 2014-11-10 MED ORDER — POLYETHYLENE GLYCOL 3350 17 G PO PACK: 17.0000 g | PACK | Freq: Every day | ORAL | Status: DC | PRN

## 2014-11-10 MED ORDER — BISACODYL 5 MG PO TBEC: 5.0000 mg | DELAYED_RELEASE_TABLET | Freq: Every day | ORAL | Status: DC | PRN

## 2014-11-10 MED ORDER — MENTHOL 3 MG MT LOZG: 1.0000 | LOZENGE | OROMUCOSAL | Status: DC | PRN

## 2014-11-10 MED ORDER — HYDROMORPHONE HCL 1 MG/ML IJ SOLN: 0.5000 mg | INTRAMUSCULAR | Status: DC | PRN

## 2014-11-10 MED ORDER — MEPERIDINE HCL 50 MG/ML IJ SOLN: 6.2500 mg | INTRAMUSCULAR | Status: DC | PRN

## 2014-11-10 MED ORDER — SODIUM CHLORIDE 0.9 % IR SOLN
Status: AC
  Filled 2014-11-10: qty 1

## 2014-11-10 MED ORDER — BUPIVACAINE HCL (PF) 0.5 % IJ SOLN
INTRAMUSCULAR | Status: DC | PRN
  Administered 2014-11-10: 20 mL

## 2014-11-10 MED ORDER — HYDROMORPHONE HCL 1 MG/ML IJ SOLN
0.2500 mg | INTRAMUSCULAR | Status: DC | PRN
  Administered 2014-11-10 (×4): 0.5 mg via INTRAVENOUS

## 2014-11-10 MED ORDER — BACITRACIN-NEOMYCIN-POLYMYXIN 400-5-5000 EX OINT
TOPICAL_OINTMENT | CUTANEOUS | Status: DC | PRN
  Administered 2014-11-10: 1 via TOPICAL

## 2014-11-10 MED ORDER — ROCURONIUM BROMIDE 100 MG/10ML IV SOLN
INTRAVENOUS | Status: DC | PRN
  Administered 2014-11-10: 10 mg via INTRAVENOUS
  Administered 2014-11-10: 40 mg via INTRAVENOUS
  Administered 2014-11-10: 10 mg via INTRAVENOUS

## 2014-11-10 MED ORDER — GLYCOPYRROLATE 0.2 MG/ML IJ SOLN
INTRAMUSCULAR | Status: AC
  Filled 2014-11-10: qty 3

## 2014-11-10 MED ORDER — ACETAMINOPHEN 650 MG RE SUPP: 650.0000 mg | RECTAL | Status: DC | PRN

## 2014-11-10 MED ORDER — ONDANSETRON HCL 4 MG/2ML IJ SOLN
4.0000 mg | INTRAMUSCULAR | Status: DC | PRN
  Administered 2014-11-10 (×2): 4 mg via INTRAVENOUS
  Filled 2014-11-10 (×2): qty 2

## 2014-11-10 MED ORDER — LIDOCAINE HCL (CARDIAC) 20 MG/ML IV SOLN
INTRAVENOUS | Status: DC | PRN
  Administered 2014-11-10: 100 mg via INTRAVENOUS

## 2014-11-10 MED ORDER — THROMBIN 5000 UNITS EX SOLR
OROMUCOSAL | Status: DC | PRN
  Administered 2014-11-10: 11:00:00 via TOPICAL

## 2014-11-10 MED ORDER — PROPOFOL 10 MG/ML IV BOLUS
INTRAVENOUS | Status: DC | PRN
  Administered 2014-11-10: 200 mg via INTRAVENOUS

## 2014-11-10 MED ORDER — THROMBIN 5000 UNITS EX SOLR
CUTANEOUS | Status: AC
  Filled 2014-11-10: qty 10000

## 2014-11-10 MED ORDER — HYDROMORPHONE HCL 1 MG/ML IJ SOLN
0.2500 mg | INTRAMUSCULAR | Status: DC | PRN
  Administered 2014-11-10 (×2): 0.5 mg via INTRAVENOUS

## 2014-11-10 MED ORDER — HYDROMORPHONE HCL 2 MG PO TABS: 2.0000 mg | ORAL_TABLET | ORAL | Status: AC | PRN

## 2014-11-10 MED ORDER — MIDAZOLAM HCL 2 MG/2ML IJ SOLN
INTRAMUSCULAR | Status: AC
  Filled 2014-11-10: qty 2

## 2014-11-10 MED ORDER — ACETAMINOPHEN 325 MG PO TABS
650.0000 mg | ORAL_TABLET | ORAL | Status: DC | PRN
  Administered 2014-11-10 – 2014-11-11 (×2): 650 mg via ORAL
  Filled 2014-11-10 (×2): qty 2

## 2014-11-10 MED ORDER — BACITRACIN-NEOMYCIN-POLYMYXIN 400-5-5000 EX OINT
TOPICAL_OINTMENT | CUTANEOUS | Status: AC
  Filled 2014-11-10: qty 1

## 2014-11-10 MED ORDER — NEOSTIGMINE METHYLSULFATE 10 MG/10ML IV SOLN
INTRAVENOUS | Status: DC | PRN
  Administered 2014-11-10: 4 mg via INTRAVENOUS

## 2014-11-10 MED ORDER — MIDAZOLAM HCL 5 MG/5ML IJ SOLN
INTRAMUSCULAR | Status: DC | PRN
  Administered 2014-11-10: 2 mg via INTRAVENOUS

## 2014-11-10 MED ORDER — ONDANSETRON HCL 4 MG/2ML IJ SOLN
INTRAMUSCULAR | Status: AC
  Filled 2014-11-10: qty 2

## 2014-11-10 MED ORDER — PROMETHAZINE HCL 25 MG/ML IJ SOLN: 6.2500 mg | INTRAMUSCULAR | Status: DC | PRN

## 2014-11-10 MED ORDER — CEFAZOLIN SODIUM-DEXTROSE 2-3 GM-% IV SOLR
2.0000 g | INTRAVENOUS | Status: AC
  Administered 2014-11-10: 2 g via INTRAVENOUS

## 2014-11-10 MED ORDER — LACTATED RINGERS IV SOLN
INTRAVENOUS | Status: DC
  Administered 2014-11-10 – 2014-11-11 (×2): via INTRAVENOUS

## 2014-11-10 MED ORDER — LIDOCAINE HCL (CARDIAC) 20 MG/ML IV SOLN
INTRAVENOUS | Status: AC
  Filled 2014-11-10: qty 5

## 2014-11-10 MED ORDER — GLYCOPYRROLATE 0.2 MG/ML IJ SOLN
INTRAMUSCULAR | Status: DC | PRN
  Administered 2014-11-10: .7 mg via INTRAVENOUS

## 2014-11-10 MED ORDER — FENTANYL CITRATE 0.05 MG/ML IJ SOLN
INTRAMUSCULAR | Status: DC | PRN
  Administered 2014-11-10 (×5): 50 ug via INTRAVENOUS

## 2014-11-10 MED ORDER — CITALOPRAM HYDROBROMIDE 40 MG PO TABS
40.0000 mg | ORAL_TABLET | Freq: Every day | ORAL | Status: DC
  Administered 2014-11-10: 40 mg via ORAL
  Filled 2014-11-10 (×2): qty 1

## 2014-11-10 MED ORDER — ROCURONIUM BROMIDE 100 MG/10ML IV SOLN
INTRAVENOUS | Status: AC
  Filled 2014-11-10: qty 1

## 2014-11-10 MED ORDER — METHOCARBAMOL 500 MG PO TABS: 500.0000 mg | ORAL_TABLET | Freq: Four times a day (QID) | ORAL | Status: AC | PRN

## 2014-11-10 MED ORDER — DEXAMETHASONE SODIUM PHOSPHATE 10 MG/ML IJ SOLN
INTRAMUSCULAR | Status: DC | PRN
  Administered 2014-11-10: 10 mg via INTRAVENOUS

## 2014-11-10 MED ORDER — SUCCINYLCHOLINE CHLORIDE 20 MG/ML IJ SOLN
INTRAMUSCULAR | Status: DC | PRN
  Administered 2014-11-10: 120 mg via INTRAVENOUS

## 2014-11-10 MED ORDER — METHOCARBAMOL 500 MG PO TABS: 500.0000 mg | ORAL_TABLET | Freq: Four times a day (QID) | ORAL | Status: DC | PRN

## 2014-11-10 MED ORDER — CEFAZOLIN SODIUM-DEXTROSE 2-3 GM-% IV SOLR
INTRAVENOUS | Status: AC
  Filled 2014-11-10: qty 50

## 2014-11-10 MED ORDER — SODIUM CHLORIDE 0.9 % IR SOLN
Status: DC | PRN
  Administered 2014-11-10: 500 mL

## 2014-11-10 SURGICAL SUPPLY — 40 items
BAG ZIPLOCK 12X15 (MISCELLANEOUS) ×3 IMPLANT
BENZOIN TINCTURE PRP APPL 2/3 (GAUZE/BANDAGES/DRESSINGS) ×3 IMPLANT
CLEANER TIP ELECTROSURG 2X2 (MISCELLANEOUS) ×3 IMPLANT
DRAIN PENROSE 18X1/4 LTX STRL (WOUND CARE) IMPLANT
DRAPE MICROSCOPE LEICA (MISCELLANEOUS) ×3 IMPLANT
DRAPE POUCH INSTRU U-SHP 10X18 (DRAPES) ×3 IMPLANT
DRAPE SURG 17X11 SM STRL (DRAPES) ×3 IMPLANT
DRSG ADAPTIC 3X8 NADH LF (GAUZE/BANDAGES/DRESSINGS) ×3 IMPLANT
DRSG PAD ABDOMINAL 8X10 ST (GAUZE/BANDAGES/DRESSINGS) ×9 IMPLANT
DURAPREP 26ML APPLICATOR (WOUND CARE) ×3 IMPLANT
ELECT REM PT RETURN 9FT ADLT (ELECTROSURGICAL) ×3
ELECTRODE REM PT RTRN 9FT ADLT (ELECTROSURGICAL) ×1 IMPLANT
GAUZE SPONGE 4X4 12PLY STRL (GAUZE/BANDAGES/DRESSINGS) ×3 IMPLANT
GLOVE BIOGEL PI IND STRL 8 (GLOVE) ×1 IMPLANT
GLOVE BIOGEL PI INDICATOR 8 (GLOVE) ×2
GLOVE ECLIPSE 8.0 STRL XLNG CF (GLOVE) ×6 IMPLANT
GOWN STRL REUS W/TWL LRG LVL3 (GOWN DISPOSABLE) ×9 IMPLANT
GOWN STRL REUS W/TWL XL LVL3 (GOWN DISPOSABLE) ×6 IMPLANT
KIT BASIN OR (CUSTOM PROCEDURE TRAY) ×3 IMPLANT
KIT POSITIONING SURG ANDREWS (MISCELLANEOUS) ×3 IMPLANT
MANIFOLD NEPTUNE II (INSTRUMENTS) ×3 IMPLANT
NEEDLE SPNL 18GX3.5 QUINCKE PK (NEEDLE) ×6 IMPLANT
NS IRRIG 1000ML POUR BTL (IV SOLUTION) ×3 IMPLANT
PACK LAMINECTOMY ORTHO (CUSTOM PROCEDURE TRAY) ×3 IMPLANT
PATTIES SURGICAL .5 X.5 (GAUZE/BANDAGES/DRESSINGS) IMPLANT
PATTIES SURGICAL .75X.75 (GAUZE/BANDAGES/DRESSINGS) IMPLANT
PATTIES SURGICAL 1X1 (DISPOSABLE) IMPLANT
PIN SAFETY NICK PLATE  2 MED (MISCELLANEOUS)
PIN SAFETY NICK PLATE 2 MED (MISCELLANEOUS) IMPLANT
POSITIONER SURGICAL ARM (MISCELLANEOUS) ×3 IMPLANT
SPONGE LAP 4X18 X RAY DECT (DISPOSABLE) ×3 IMPLANT
SPONGE SURGIFOAM ABS GEL 100 (HEMOSTASIS) ×3 IMPLANT
STAPLER VISISTAT 35W (STAPLE) IMPLANT
SUT VIC AB 0 CT1 27 (SUTURE) ×2
SUT VIC AB 0 CT1 27XBRD ANTBC (SUTURE) ×1 IMPLANT
SUT VIC AB 1 CT1 27 (SUTURE) ×8
SUT VIC AB 1 CT1 27XBRD ANTBC (SUTURE) ×4 IMPLANT
TAPE CLOTH SURG 6X10 WHT LF (GAUZE/BANDAGES/DRESSINGS) ×3 IMPLANT
TOWEL OR 17X26 10 PK STRL BLUE (TOWEL DISPOSABLE) ×6 IMPLANT
WATER STERILE IRR 1500ML POUR (IV SOLUTION) ×3 IMPLANT

## 2014-11-10 NOTE — Anesthesia Preprocedure Evaluation (Addendum)
Anesthesia Evaluation  Patient identified by MRN, date of birth, ID band Patient awake    Reviewed: Allergy & Precautions, H&P , NPO status , Patient's Chart, lab work & pertinent test results  History of Anesthesia Complications (+) PONV and history of anesthetic complications  Airway Mallampati: II  TM Distance: >3 FB Neck ROM: Full    Dental no notable dental hx.    Pulmonary shortness of breath, former smoker,  breath sounds clear to auscultation  Pulmonary exam normal       Cardiovascular hypertension, Pt. on medications Rhythm:Regular Rate:Normal     Neuro/Psych PSYCHIATRIC DISORDERS Anxiety Depression negative neurological ROS     GI/Hepatic Neg liver ROS, GERD-  ,  Endo/Other  Morbid obesity  Renal/GU negative Renal ROS     Musculoskeletal negative musculoskeletal ROS (+) Arthritis -, Fibromyalgia -  Abdominal   Peds  Hematology negative hematology ROS (+)   Anesthesia Other Findings   Reproductive/Obstetrics negative OB ROS                            Anesthesia Physical Anesthesia Plan  ASA: III  Anesthesia Plan: General   Post-op Pain Management:    Induction: Intravenous  Airway Management Planned: Oral ETT  Additional Equipment: None  Intra-op Plan:   Post-operative Plan: Extubation in OR  Informed Consent: I have reviewed the patients History and Physical, chart, labs and discussed the procedure including the risks, benefits and alternatives for the proposed anesthesia with the patient or authorized representative who has indicated his/her understanding and acceptance.   Dental advisory given  Plan Discussed with: CRNA  Anesthesia Plan Comments:         Anesthesia Quick Evaluation

## 2014-11-10 NOTE — Brief Op Note (Signed)
11/10/2014  11:45 AM  PATIENT:  Rhonda Moreno  53 y.o. 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 Decompression for Spinal Stenosis at L-4-L-5  SURGEON:  Surgeon(s) and Role:    * Jacki Cones, MD - Primary    * Javier Docker, MD - Assisting  :   ASSISTANTS: Jene Every MD  ANESTHESIA:   general  EBL:  Total I/O In: 1000 [I.V.:1000] Out: -   BLOOD ADMINISTERED:none  DRAINS: none   LOCAL MEDICATIONS USED:  MARCAINE 20cc of 0.50% with Epinephrine at start of Case and 20cc of Exparel at the end of the case.     SPECIMEN:  Source of Specimen:  L-4-L-5  DISPOSITION OF SPECIMEN:  PATHOLOGY  COUNTS:  YES  TOURNIQUET:  * No tourniquets in log *  DICTATION: .Other Dictation: Dictation Number [id number]  PLAN OF CARE: Admit for overnight observation  PATIENT DISPOSITION:  PACU - hemodynamically stable.   Delay start of Pharmacological VTE agent (>24hrs) due to surgical blood loss or risk of bleeding: yes

## 2014-11-10 NOTE — H&P (Signed)
Rhonda Moreno is an 53 y.o. female.   Chief Complaint: Pain in both Legs. HPI: She states that she injured her back lifting a heavy object at work.MRI showe a HNP,Lumbar.  Past Medical History  Diagnosis Date  . Complication of anesthesia     NAUSEA  . PONV (postoperative nausea and vomiting)     NAUSEA  . Hypertension   . Shortness of breath dyspnea     OCCASIONALLLY WITH EXERTION  . Leaky heart valve     "2 VALVES" NO TREATMENT NEEDED AT THIS TIME  - Followed by [facility] - notes in chart  . Numbness in both legs     worse in rt leg  . Arthritis   . Rheumatoid arthritis   . Psoriatic arthritis   . Spinal stenosis at L4-L5 level   . Fibromyalgia   . Psoriasis   . GERD (gastroesophageal reflux disease)   . Urgency of urination   . Thyroid nodule   . Difficulty sleeping   . Depression   . Anxiety   . Osteopenia     Past Surgical History  Procedure Laterality Date  . Cholecystectomy    . Abdominal hysterectomy  2010  . Cesarean section      X2  . Foot surgery      LEFT    History reviewed. No pertinent family history. Social History:  reports that she quit smoking about 5 years ago. She does not have any smokeless tobacco history on file. She reports that she does not drink alcohol or use illicit drugs.  Allergies:  Allergies  Allergen Reactions  . Codeine     Makes her mean  . Morphine And Related     Makes her mean    Medications Prior to Admission  Medication Sig Dispense Refill  . Calcium Carbonate-Vitamin D (CALCIUM + D PO) Take 1 tablet by mouth daily.    . citalopram (CELEXA) 40 MG tablet Take 40 mg by mouth at bedtime.    . clonazePAM (KLONOPIN) 1 MG tablet Take 1 mg by mouth at bedtime.    Marland Kitchen etodolac (LODINE) 400 MG tablet Take 400 mg by mouth 2 (two) times daily.    Marland Kitchen HYDROmorphone (DILAUDID) 2 MG tablet Take 2 mg by mouth every 6 (six) hours as needed for moderate pain or severe pain.    Marland Kitchen lisinopril (PRINIVIL,ZESTRIL) 20 MG tablet Take 20  mg by mouth at bedtime.    . pantoprazole (PROTONIX) 40 MG tablet Take 40 mg by mouth daily.    Marland Kitchen Ustekinumab (STELARA) 90 MG/ML SOSY Inject into the skin every 3 (three) months.      No results found for this or any previous visit (from the past 48 hour(s)). No results found.  Review of Systems  Constitutional: Negative.   HENT: Negative.   Eyes: Negative.   Respiratory: Negative.   Cardiovascular: Negative.   Gastrointestinal: Negative.   Genitourinary: Negative.   Musculoskeletal: Positive for back pain.  Skin: Negative.   Neurological: Positive for focal weakness.  Endo/Heme/Allergies: Negative.   Psychiatric/Behavioral: Negative.     Blood pressure 153/98, pulse 97, temperature 97.5 F (36.4 C), temperature source Oral, resp. rate 16, height 5\' 2"  (1.575 m), weight 105.688 kg (233 lb), SpO2 98 %. Physical Exam  Constitutional: She appears well-developed.  HENT:  Head: Normocephalic.  Eyes: Pupils are equal, round, and reactive to light.  Neck: Normal range of motion.  Cardiovascular: Normal rate.   Respiratory: Effort normal.  GI: Soft.  Musculoskeletal:  Weakness of dorsiflexors of both feet.  Skin: Skin is warm.     Assessment/Plan Decompressive Lumbar Laminectomy and Microdiscectomy at L-4-L-5 on the Right.  Rhonda Moreno A 11/10/2014, 9:15 AM

## 2014-11-10 NOTE — Progress Notes (Signed)
PACU note-----pt c/o numbness to both her feet; pt can move feet and has good strength to feet but states they are numb; feet are warm with +2 dorsalis pedal pulses; Dr. Darrelyn Hillock and Dimitri Ped, PA made aware, in to check pt, and states it is OK and Dr. Darrelyn Hillock will continue to assess pt regarding numbness throughout the day

## 2014-11-10 NOTE — Transfer of Care (Signed)
Immediate Anesthesia Transfer of Care Note  Patient: Rhonda Moreno  Procedure(s) Performed: Procedure(s) (LRB): CENTRAL DECOMPRESSIVE LUMBAR LAMINECTOMY LEVEL 1 AND MICRODEISCECTOMY L4-5 ON RIGHT WITH EXCISION OF SYNOVIAL CYST OF L4-5 ON RIGHT (Right)  Patient Location: PACU  Anesthesia Type: General  Level of Consciousness: sedated, patient cooperative and responds to stimulation  Airway & Oxygen Therapy: Patient Spontanous Breathing and Patient connected to face mask oxgen  Post-op Assessment: Report given to PACU RN and Post -op Vital signs reviewed and stable  Post vital signs: Reviewed and stable  Complications: No apparent anesthesia complications

## 2014-11-10 NOTE — Anesthesia Postprocedure Evaluation (Signed)
Anesthesia Post Note  Patient: Rhonda Moreno  Procedure(s) Performed: Procedure(s) (LRB): CENTRAL DECOMPRESSIVE LUMBAR LAMINECTOMY LEVEL 1 AND MICRODEISCECTOMY L4-5 ON RIGHT WITH EXCISION OF SYNOVIAL CYST OF L4-5 ON RIGHT (Right)  Anesthesia type: General  Patient location: PACU  Post pain: Pain level controlled  Post assessment: Post-op Vital signs reviewed  Last Vitals: BP 110/57 mmHg  Pulse 99  Temp(Src) 36.7 C (Oral)  Resp 14  Ht 5\' 2"  (1.575 m)  Wt 233 lb (105.688 kg)  BMI 42.61 kg/m2  SpO2 94%  Post vital signs: Reviewed  Level of consciousness: sedated  Complications: No apparent anesthesia complications

## 2014-11-10 NOTE — Discharge Instructions (Signed)
Change your dressing daily. °Shower only, no tub bath.  °For the first few days, remove your dressing, tape a piece of saran wrap over your incision. °Take your shower, then remove the saran wrap and put a clean dressing on. °After three days you can shower without the saran wrap.  °Call if any temperatures greater than 101 or any wound complications: [phone] during the day and ask for Dr. Gioffre's nurse, Tammy Johnson. °

## 2014-11-10 NOTE — Interval H&P Note (Signed)
History and Physical Interval Note:  11/10/2014 9:20 AM  Rhonda Moreno  has presented today for surgery, with the diagnosis of SPINAL STENOSIS  The various methods of treatment have been discussed with the patient and family. After consideration of risks, benefits and other options for treatment, the patient has consented to  Procedure(s): CENTRAL DECOMPRESSIVE LUMBAR LAMINECTOMY LEVEL 1 AND MICRODEISCECTOMY L4-5 ON RIGHT WITH EXCISION OF SYNOVIAL CYST OF L4-5 ON RIGHT (Right) as a surgical intervention .  The patient's history has been reviewed, patient examined, no change in status, stable for surgery.  I have reviewed the patient's chart and labs.  Questions were answered to the patient's satisfaction.     [name]

## 2014-11-10 NOTE — Progress Notes (Signed)
PACU note-----pt c/o pain level 8 continuing; states med not helping; pt has been tearful; Dr. Renold Don called and OK to give pt additional pain meds

## 2014-11-11 ENCOUNTER — Encounter (HOSPITAL_COMMUNITY): Payer: Self-pay | Admitting: Orthopedic Surgery

## 2014-11-11 DIAGNOSIS — M5126 Other intervertebral disc displacement, lumbar region: Secondary | ICD-10-CM | POA: Diagnosis not present

## 2014-11-11 NOTE — Evaluation (Signed)
Physical Therapy Evaluation Patient Details Name: Rhonda Moreno MRN: [id number] DOB: 06-18-1961 Today's Date: 11/11/2014   History of Present Illness  CENTRAL DECOMPRESSIVE LUMBAR LAMINECTOMY LEVEL 1 AND MICRODEISCECTOMY L4-5 ON RIGHT WITH EXCISION OF SYNOVIAL CYST OF L4-5 ON RIGHT   Clinical Impression  Pt s/p lumbar laminectomy presents with post op pain and back precautions limiting functional mobility.  Pt plans d.c home with family assist.  No immediate PT needs but pt would benefit from use of RW at home.     Follow Up Recommendations No PT follow up    Equipment Recommendations  Rolling walker with 5" wheels    Recommendations for Other Services OT consult     Precautions / Restrictions Precautions Precautions: Back Precaution Booklet Issued: Yes (comment) Precaution Comments: All precautions reviewed multiple times Restrictions Weight Bearing Restrictions: No      Mobility  Bed Mobility Overal bed mobility: Needs Assistance Bed Mobility: Rolling;Supine to Sit Rolling: Min guard   Supine to sit: Min guard     General bed mobility comments: cues for log roll and adherence to back precautions  Transfers Overall transfer level: Needs assistance Equipment used: Rolling walker (2 wheeled) Transfers: Sit to/from Stand Sit to Stand: Supervision         General transfer comment: VC for hand placement and safety  Ambulation/Gait Ambulation/Gait assistance: Min guard;Supervision Ambulation Distance (Feet): 200 Feet Assistive device: Rolling walker (2 wheeled) Gait Pattern/deviations: Step-through pattern;Decreased step length - right;Decreased step length - left;Shuffle Gait velocity: decr Gait velocity interpretation: Below normal speed for age/gender General Gait Details: min cues for posture and position from RW  Stairs Stairs: Yes Stairs assistance: Min guard Stair Management: One rail Right Number of Stairs: 3 General stair comments: cues for  sequence  Wheelchair Mobility    Modified Rankin (Stroke Patients Only)       Balance                                             Pertinent Vitals/Pain Pain Assessment: 0-10 Pain Score: 5  Pain Location: back Pain Descriptors / Indicators: Aching Pain Intervention(s): Limited activity within patient's tolerance;Monitored during session;Premedicated before session    Home Living Family/patient expects to be discharged to:: Private residence Living Arrangements: Spouse/significant other Available Help at Discharge: Family Type of Home: House Home Access: Stairs to enter Entrance Stairs-Rails: Right Entrance Stairs-Number of Steps: 10 Home Layout: One level;Able to live on main level with bedroom/bathroom Home Equipment: None      Prior Function Level of Independence: Independent               Hand Dominance        Extremity/Trunk Assessment   Upper Extremity Assessment: Overall WFL for tasks assessed           Lower Extremity Assessment: Overall WFL for tasks assessed         Communication   Communication: No difficulties  Cognition Arousal/Alertness: Awake/alert Behavior During Therapy: WFL for tasks assessed/performed Overall Cognitive Status: Within Functional Limits for tasks assessed                      General Comments      Exercises        Assessment/Plan    PT Assessment Patient needs continued PT services  PT Diagnosis Difficulty walking   PT Problem List  Decreased activity tolerance;Decreased mobility;Decreased knowledge of use of DME;Pain;Decreased knowledge of precautions  PT Treatment Interventions DME instruction;Gait training;Stair training;Functional mobility training;Therapeutic activities;Patient/family education   PT Goals (Current goals can be found in the Care Plan section) Acute Rehab PT Goals Patient Stated Goal: go home today PT Goal Formulation: All assessment and education complete,  DC therapy Time For Goal Achievement: 11/12/14 Potential to Achieve Goals: Good    Frequency Min 6X/week   Barriers to discharge        Co-evaluation               End of Session   Activity Tolerance: Patient tolerated treatment well Patient left: in chair;with call bell/phone within reach Nurse Communication: Mobility status    Functional Assessment Tool Used: clinical judgement Functional Limitation: Mobility: Walking and moving around Mobility: Walking and Moving Around Current Status (X1062): At least 1 percent but less than 20 percent impaired, limited or restricted Mobility: Walking and Moving Around Goal Status [phone]): At least 1 percent but less than 20 percent impaired, limited or restricted Mobility: Walking and Moving Around Discharge Status [phone]): At least 1 percent but less than 20 percent impaired, limited or restricted    Time: 1320-1405 PT Time Calculation (min) (ACUTE ONLY): 45 min   Charges:   PT Evaluation $Initial PT Evaluation Tier I: 1 Procedure PT Treatments $Gait Training: 8-22 mins   PT G Codes:   PT G-Codes **NOT FOR INPATIENT CLASS** Functional Assessment Tool Used: clinical judgement Functional Limitation: Mobility: Walking and moving around Mobility: Walking and Moving Around Current Status (J5009): At least 1 percent but less than 20 percent impaired, limited or restricted Mobility: Walking and Moving Around Goal Status [phone]): At least 1 percent but less than 20 percent impaired, limited or restricted Mobility: Walking and Moving Around Discharge Status [phone]): At least 1 percent but less than 20 percent impaired, limited or restricted    Gwendolyn Mclees 11/11/2014, 2:25 PM

## 2014-11-11 NOTE — Progress Notes (Signed)
Subjective: 1 Day Post-Op Procedure(s) (LRB): CENTRAL DECOMPRESSIVE LUMBAR LAMINECTOMY LEVEL 1 AND MICRODEISCECTOMY L4-5 ON RIGHT WITH EXCISION OF SYNOVIAL CYST OF L4-5 ON RIGHT (Right) Patient reports pain as 2 on 0-10 scale.Doing very well. Up with PT then DC.    Objective: Vital signs in last 24 hours: Temp:  [97.5 F (36.4 C)-98.3 F (36.8 C)] 98 F (36.7 C) (12/31 0502) Pulse Rate:  [86-105] 93 (12/31 0502) Resp:  [12-17] 16 (12/31 0502) BP: (95-153)/(57-98) 95/65 mmHg (12/31 0502) SpO2:  [92 %-98 %] 92 % (12/31 0502) Weight:  [105.688 kg (233 lb)] 105.688 kg (233 lb) (12/30 0807)  Intake/Output from previous day: 12/30 0701 - 12/31 0700 In: 4064.7 [P.O.:1080; I.V.:2884.7; IV Piggyback:100] Out: 1675 [Urine:1625; Blood:50] Intake/Output this shift:    No results for input(s): HGB in the last 72 hours. No results for input(s): WBC, RBC, HCT, PLT in the last 72 hours. No results for input(s): NA, K, CL, CO2, BUN, CREATININE, GLUCOSE, CALCIUM in the last 72 hours. No results for input(s): LABPT, INR in the last 72 hours.  Dorsiflexion/Plantar flexion intact  Assessment/Plan: 1 Day Post-Op Procedure(s) (LRB): CENTRAL DECOMPRESSIVE LUMBAR LAMINECTOMY LEVEL 1 AND MICRODEISCECTOMY L4-5 ON RIGHT WITH EXCISION OF SYNOVIAL CYST OF L4-5 ON RIGHT (Right) Discharge home with home health  Rhonda Moreno A 11/11/2014, 7:24 AM

## 2014-11-11 NOTE — Op Note (Signed)
Rhonda Moreno, Rhonda Moreno NO.:  [id number]  MEDICAL RECORD NO.:  [id number]  LOCATION:  1608                         FACILITY:  [facility]  PHYSICIAN:  Georges Lynch. Tyshell Ramberg, M.D.DATE OF BIRTH:  1961/07/04  DATE OF PROCEDURE:  11/10/2014 DATE OF DISCHARGE:                              OPERATIVE REPORT   SURGEON:  Georges Lynch. Darrelyn Hillock, MD.  ASSISTANT:  Jene Every, MD.  PREOPERATIVE DIAGNOSES: 1. 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 2. Large herniated disc at L4-5, central to the right. 3. Synovial cyst at L4-5 on the right. 4. Spinal stenosis at L4-5.  POSTOPERATIVE DIAGNOSES: 1. 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 2. Large herniated disc at L4-5, central to the right. 3. Synovial cyst at L4-5 on the right. 4. Spinal stenosis at L4-5.  OPERATION: 1. Central decompressive lumbar laminectomy at L4-5 for spinal     stenosis. 2. Microdiscectomy at L4-5 on the right. 3. Excision of synovial cyst at 4-5 on the right. 4. Foraminotomy for the L4 and L5 roots on the right.  DESCRIPTION OF PROCEDURE:  Under general anesthesia with the patient on a spinal frame, routine orthopedic prep and draping of the lower back was carried out.  Appropriate time-out was first carried out.  I also marked the appropriate right side of the back even though we went central.  I marked her in the holding area.  The patient had 2 g of IV Ancef.  At this time, 2 needles were placed in the back for localization purposes.  X-ray was taken.  We then made an incision over the L4-5 space.  I extended it proximally and distally in the usual fashion.  I then cauterized the bleeders.  At this time, I stripped the muscle from the lamina spinous process bilaterally.  I then placed a Kocher clamp on the spinous process of L4.  An x-ray was taken.  Once we identified the space, I did a central decompressive lumbar laminectomy at L4-5.  Note, the lamina was quite loose because of her pars defects  bilaterally. After decompressing her, we went proximally and distally to make sure we had a good decompression.  We went laterally as well on both sides. Following that, we then went over to the right side.  We preserved the facets.  We sized the ligamentum flavum after the microscope was brought in.  We had a nice decompression of the dura.  We protected the dura, identified the disc space, and a needle was placed in the disc space, and an x-ray was taken.  Following that, we cauterized the lateral recess veins.  We had significant amount of venous bleeding from these recess veins.  We had them under good control though.  At this time, I loosely applied some thrombin-soaked Gelfoam distally and proximally over the recess, and we made a cruciate incision in the posterior longitudinal ligament and did a microdiscectomy.  The nerve roots now on 4 and 5 were completely free.  The dura was free.  We thoroughly irrigated out the area, and loosely applied some thrombin-soaked Gelfoam.  Then closed the wound in layers in usual fashion.  Prior to closing the subcu, I injected 20 mL of Exparel into  the soft tissue. Also, prior to doing the surgery, I injected 20 mL of 0.5% Marcaine with epinephrine into the subcu and muscle to prevent bleeding during the incision.  After the wound was closed, sterile dressings were applied. The patient left the operating room in satisfactory condition.          ______________________________ Georges Lynch Darrelyn Hillock, M.D.     RAG/MEDQ  D:  11/10/2014  T:  11/10/2014  Job:  [id number]

## 2014-11-11 NOTE — Progress Notes (Signed)
UR completed 

## 2014-11-11 NOTE — Evaluation (Signed)
Occupational Therapy Evaluation Patient Details Name: Karyss Frese MRN: [id number] DOB: 1960-12-15 Today's Date: 11/11/2014    History of Present Illness CENTRAL DECOMPRESSIVE LUMBAR LAMINECTOMY LEVEL 1 AND MICRODEISCECTOMY L4-5 ON RIGHT WITH EXCISION OF SYNOVIAL CYST OF L4-5 ON RIGHT    Clinical Impression   Education complete regarding ADL activity s/p back surgery    Follow Up Recommendations  No OT follow up    Equipment Recommendations  None recommended by OT    Recommendations for Other Services       Precautions / Restrictions Precautions Precautions: Back      Mobility Bed Mobility               General bed mobility comments: pt sitting EOB  Transfers Overall transfer level: Needs assistance Equipment used: Rolling walker (2 wheeled) Transfers: Sit to/from Stand Sit to Stand: Supervision         General transfer comment: VC for hand placement and safety         ADL Overall ADL's : Needs assistance/impaired                     Lower Body Dressing: Minimal assistance   Toilet Transfer: Minimal assistance;BSC;RW   Toileting- Clothing Manipulation and Hygiene: Supervision/safety;Sit to/from stand         General ADL Comments: Pt overall S- min A with ADL activity following back precautions.  Pt=s husband will A as needed     Vision                            Pertinent Vitals/Pain Pain Assessment: 0-10 Pain Score: 5  Pain Location: back Pain Descriptors / Indicators: Aching     Hand Dominance     Extremity/Trunk Assessment Upper Extremity Assessment Upper Extremity Assessment: Overall WFL for tasks assessed           Communication Communication Communication: No difficulties   Cognition Arousal/Alertness: Awake/alert Behavior During Therapy: WFL for tasks assessed/performed Overall Cognitive Status: Within Functional Limits for tasks assessed                     General Comments              Home Living Family/patient expects to be discharged to:: Private residence Living Arrangements: Spouse/significant other Available Help at Discharge: Family Type of Home: House Home Access: Stairs to enter     Home Layout: One level;Able to live on main level with bedroom/bathroom     Bathroom Shower/Tub: Tub/shower unit;Curtain Shower/tub characteristics: Engineer, building services: Standard Bathroom Accessibility: Yes   Home Equipment: None          Prior Functioning/Environment Level of Independence: Independent             OT Diagnosis:     OT Problem List:     OT Treatment/Interventions:      OT Goals(Current goals can be found in the care plan section) Acute Rehab OT Goals Patient Stated Goal: go home today OT Goal Formulation: With patient  OT Frequency:     Barriers to D/C:            Co-evaluation              End of Session Nurse Communication: Mobility status  Activity Tolerance: Patient tolerated treatment well Patient left: in chair;with call bell/phone within reach   Time: 1345-1405 OT Time Calculation (min): 20 min Charges:  OT General  Charges $OT Visit: 1 Procedure OT Evaluation $Initial OT Evaluation Tier I: 1 Procedure OT Treatments $Self Care/Home Management : 8-22 mins G-Codes: OT G-codes **NOT FOR INPATIENT CLASS** Functional Assessment Tool Used: clinical observation Functional Limitation: Self care Self Care Current Status (U2353): At least 1 percent but less than 20 percent impaired, limited or restricted Self Care Goal Status (I1443): 0 percent impaired, limited or restricted Self Care Discharge Status [phone]): At least 1 percent but less than 20 percent impaired, limited or restricted  Verlyn Dannenberg, [place] Kung 11/11/2014, 2:15 PM

## 2014-11-15 NOTE — Discharge Summary (Signed)
Physician Discharge Summary   Patient ID: Rhonda Moreno MRN: [id number] DOB/AGE: [birth year] 54 y.o.  Admit date: 11/10/2014 Discharge date: 11/11/2014  Primary Diagnosis: Lumbar disc herniation                  Lumbar spinal stenosis  Admission Diagnoses:  Past Medical History  Diagnosis Date  . Complication of anesthesia     NAUSEA  . PONV (postoperative nausea and vomiting)     NAUSEA  . Hypertension   . Shortness of breath dyspnea     OCCASIONALLLY WITH EXERTION  . Leaky heart valve     "2 VALVES" NO TREATMENT NEEDED AT THIS TIME  - Followed by [facility] - notes in chart  . Numbness in both legs     worse in rt leg  . Arthritis   . Rheumatoid arthritis   . Psoriatic arthritis   . Spinal stenosis at L4-L5 level   . Fibromyalgia   . Psoriasis   . GERD (gastroesophageal reflux disease)   . Urgency of urination   . Thyroid nodule   . Difficulty sleeping   . Depression   . Anxiety   . Osteopenia    Discharge Diagnoses:   Active Problems:   Spinal stenosis, lumbar region, with neurogenic claudication  Estimated body mass index is 42.61 kg/(m^2) as calculated from the following:   Height as of this encounter: 5' 2"  (1.575 m).   Weight as of this encounter: 105.688 kg (233 lb).  Procedure:  Procedure(s) (LRB): CENTRAL DECOMPRESSIVE LUMBAR LAMINECTOMY LEVEL 1 AND MICRODEISCECTOMY L4-5 ON RIGHT WITH EXCISION OF SYNOVIAL CYST OF L4-5 ON RIGHT (Right)   Consults: None  HPI: The patient presented with low back pain after injuring her back at work. Sh developed pain into both legs as well as weakness in her LE.  Laboratory Data: Hospital Outpatient Visit on 11/02/2014  Component Date Value Ref Range Status  . aPTT 11/02/2014 31  24 - 37 seconds Final  . WBC 11/02/2014 10.3  4.0 - 10.5 K/uL Final  . RBC 11/02/2014 5.51* 3.87 - 5.11 MIL/uL Final  . Hemoglobin 11/02/2014 16.0* 12.0 - 15.0 g/dL Final  . HCT 11/02/2014 48.0* 36.0 - 46.0 % Final  . MCV 11/02/2014  87.1  78.0 - 100.0 fL Final  . MCH 11/02/2014 29.0  26.0 - 34.0 pg Final  . MCHC 11/02/2014 33.3  30.0 - 36.0 g/dL Final  . RDW 11/02/2014 14.2  11.5 - 15.5 % Final  . Platelets 11/02/2014 244  150 - 400 K/uL Final  . Neutrophils Relative % 11/02/2014 60  43 - 77 % Final  . Neutro Abs 11/02/2014 6.1  1.7 - 7.7 K/uL Final  . Lymphocytes Relative 11/02/2014 27  12 - 46 % Final  . Lymphs Abs 11/02/2014 2.8  0.7 - 4.0 K/uL Final  . Monocytes Relative 11/02/2014 8  3 - 12 % Final  . Monocytes Absolute 11/02/2014 0.8  0.1 - 1.0 K/uL Final  . Eosinophils Relative 11/02/2014 5  0 - 5 % Final  . Eosinophils Absolute 11/02/2014 0.5  0.0 - 0.7 K/uL Final  . Basophils Relative 11/02/2014 0  0 - 1 % Final  . Basophils Absolute 11/02/2014 0.0  0.0 - 0.1 K/uL Final  . Sodium 11/02/2014 143  135 - 145 mmol/L Final   Please note change in reference range.  . Potassium 11/02/2014 4.8  3.5 - 5.1 mmol/L Final   Please note change in reference range.  . Chloride 11/02/2014 105  96 - 112 mEq/L Final  . CO2 11/02/2014 29  19 - 32 mmol/L Final  . Glucose, Bld 11/02/2014 113* 70 - 99 mg/dL Final  . BUN 11/02/2014 17  6 - 23 mg/dL Final  . Creatinine, Ser 11/02/2014 0.72  0.50 - 1.10 mg/dL Final  . Calcium 11/02/2014 10.1  8.4 - 10.5 mg/dL Final  . Total Protein 11/02/2014 7.3  6.0 - 8.3 g/dL Final  . Albumin 11/02/2014 4.5  3.5 - 5.2 g/dL Final  . AST 11/02/2014 39* 0 - 37 U/L Final  . ALT 11/02/2014 64* 0 - 35 U/L Final  . Alkaline Phosphatase 11/02/2014 67  39 - 117 U/L Final  . Total Bilirubin 11/02/2014 0.7  0.3 - 1.2 mg/dL Final  . GFR calc non Af Amer 11/02/2014 >90  >90 mL/min Final  . GFR calc Af Amer 11/02/2014 >90  >90 mL/min Final   Comment: (NOTE) The eGFR has been calculated using the CKD EPI equation. This calculation has not been validated in all clinical situations. eGFR's persistently <90 mL/min signify possible Chronic Kidney Disease.   . Anion gap 11/02/2014 9  5 - 15 Final  .  Prothrombin Time 11/02/2014 13.7  11.6 - 15.2 seconds Final  . INR 11/02/2014 1.04  0.00 - 1.49 Final  . ABO/RH(D) 11/02/2014 O NEG   Final  . Antibody Screen 11/02/2014 NEG   Final  . Sample Expiration 11/02/2014 11/13/2014   Final  . Color, Urine 11/02/2014 YELLOW  YELLOW Final  . APPearance 11/02/2014 CLEAR  CLEAR Final  . Specific Gravity, Urine 11/02/2014 1.019  1.005 - 1.030 Final  . pH 11/02/2014 5.5  5.0 - 8.0 Final  . Glucose, UA 11/02/2014 NEGATIVE  NEGATIVE mg/dL Final  . Hgb urine dipstick 11/02/2014 NEGATIVE  NEGATIVE Final  . Bilirubin Urine 11/02/2014 NEGATIVE  NEGATIVE Final  . Ketones, ur 11/02/2014 NEGATIVE  NEGATIVE mg/dL Final  . Protein, ur 11/02/2014 NEGATIVE  NEGATIVE mg/dL Final  . Urobilinogen, UA 11/02/2014 0.2  0.0 - 1.0 mg/dL Final  . Nitrite 11/02/2014 NEGATIVE  NEGATIVE Final  . Leukocytes, UA 11/02/2014 NEGATIVE  NEGATIVE Final   MICROSCOPIC NOT DONE ON URINES WITH NEGATIVE PROTEIN, BLOOD, LEUKOCYTES, NITRITE, OR GLUCOSE <1000 mg/dL.  Marland Kitchen MRSA, PCR 11/02/2014 NEGATIVE  NEGATIVE Final  . Staphylococcus aureus 11/02/2014 NEGATIVE  NEGATIVE Final   Comment:        The Xpert SA Assay (FDA approved for NASAL specimens in patients over 3 years of age), is one component of a comprehensive surveillance program.  Test performance has been validated by [employer] for patients greater than or equal to 23 year old. It is not intended to diagnose infection nor to guide or monitor treatment.   . ABO/RH(D) 11/02/2014 O NEG   Final     X-Rays:Dg Chest 2 View  11/02/2014   CLINICAL DATA:  Preop.  Back surgery.  EXAM: CHEST  2 VIEW  COMPARISON:  None.  FINDINGS: Low volumes. Grossly clear. Normal heart size. No pneumothorax. No pleural effusion.  IMPRESSION: No active cardiopulmonary disease.   Electronically Signed   By: Maryclare Bean M.D.   On: 11/02/2014 16:56   Dg Lumbar Spine 2-3 Views  11/02/2014   CLINICAL DATA:  Preoperative exam prior to back surgery on  November 10, 2014.  EXAM: LUMBAR SPINE - 2-3 VIEW  COMPARISON:  None.  FINDINGS: The lumbar vertebral bodies are preserved in height. There are bilateral pars defects at L4. There is grade 1 anterolisthesis of  L4 with respect L5. There is moderate disc space narrowing at L4-5 as well. There is mild facet joint hypertrophy at L5-S1. The pedicles and transverse processes are intact. The observed portions of the sacrum are normal.  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 There is degenerative disc space narrowing at L4-5 and bilateral facet joint hypertrophy at L5-S1.   Electronically Signed   By: David  Martinique   On: 11/02/2014 16:20   Dg Spine Portable 1 View  11/10/2014   CLINICAL DATA:  Surgical level L4-5.  EXAM: PORTABLE SPINE - 1 VIEW  COMPARISON:  Intraoperative image earlier today.  FINDINGS: Posterior surgical instruments are in place with a localizing instrument projecting over the L4-5 disc space. Stable mild anterolisthesis of L4 on L5.  IMPRESSION: Localization of L4-5.   Electronically Signed   By: Rolm Baptise M.D.   On: 11/10/2014 11:13   Dg Spine Portable 1 View  11/10/2014   CLINICAL DATA:  Surgical localization.  EXAM: PORTABLE SPINE - 1 VIEW  COMPARISON:  Same day.  FINDINGS: Single lateral intraoperative radiograph of lumbar spine was obtained. Grade 1 anterolisthesis of L4-5 secondary to pars defects of L4 is again noted. Surgical probe is seen overlying the posterior spinous process of L4.  IMPRESSION: Surgical localization as described above.   Electronically Signed   By: Sabino Dick M.D.   On: 11/10/2014 10:59   Dg Spine Portable 1 View  11/10/2014   CLINICAL DATA:  Surgical localization.  EXAM: PORTABLE SPINE - 1 VIEW  COMPARISON:  November 02, 2014.  FINDINGS: Single lateral intraoperative projection of the lumbar spine was submitted for review. Pars defects are seen at L4 with grade 1 anterolisthesis at L4-5.  Surgical probes are seen overlying or adjacent to the posterior spinous processes of L3 and L4.  IMPRESSION: Surgical localization as described above.   Electronically Signed   By: Sabino Dick M.D.   On: 11/10/2014 10:47      Hospital Course: Teneshia Hedeen is a 54 y.o. who was admitted to [facility]. They were brought to the operating room on 11/10/2014 and underwent Procedure(s): [place] 1 AND MICRODEISCECTOMY L4-5 ON RIGHT WITH EXCISION OF SYNOVIAL CYST OF L4-5 ON RIGHT.  Patient tolerated the procedure well and was later transferred to the recovery room and then to the orthopaedic floor for postoperative care.  They were given PO and IV analgesics for pain control following their surgery.  They were given 24 hours of postoperative antibiotics of  Anti-infectives    Start     Dose/Rate Route Frequency Ordered Stop   11/10/14 1600  ceFAZolin (ANCEF) IVPB 1 g/50 mL premix     1 g100 mL/hr over 30 Minutes Intravenous 3 times per day 11/10/14 1359 11/11/14 0723   11/10/14 1111  polymyxin B 500,000 Units, bacitracin 50,000 Units in sodium chloride irrigation 0.9 % 500 mL irrigation  Status:  Discontinued       As needed 11/10/14 1111 11/10/14 1151   11/10/14 0752  ceFAZolin (ANCEF) IVPB 2 g/50 mL premix     2 g100 mL/hr over 30 Minutes Intravenous On call to O.R. 11/10/14 5643 11/10/14 1025     and started on DVT prophylaxis in the form of Aspirin.   PT was ordered.  Discharge planning consulted to help with postop disposition and equipment needs.  Patient had a good night on the evening of surgery.  They started to get up OOB with therapy on day one. Patient was seen in rounds and was ready to go home.   Diet: Cardiac diet  Activity:WBAT Follow-up:in 2 weeks Disposition - Home Discharged Condition: stable   Discharge Instructions    Call MD / Call 911    Complete by:  As directed   If you experience chest pain or shortness of breath, CALL 911  and be transported to the hospital emergency room.  If you develope a fever above 101 F, pus (white drainage) or increased drainage or redness at the wound, or calf pain, call your surgeon's office.     Constipation Prevention    Complete by:  As directed   Drink plenty of fluids.  Prune juice may be helpful.  You may use a stool softener, such as Colace (over the counter) 100 mg twice a day.  Use MiraLax (over the counter) for constipation as needed.     Diet - low sodium heart healthy    Complete by:  As directed      Discharge instructions    Complete by:  As directed   Change your dressing daily. Shower only, no tub bath. For the first few days, remove your dressing, tape a piece of saran wrap over your incision.  Take your shower, then remove the saran wrap and put a clean dressing on. After three days you can shower without the saran wrap.  Call if any temperatures greater than 101 or any wound complications: [phone] during the day and ask for Dr. Charlestine Night nurse, Brunilda Payor.     Driving restrictions    Complete by:  As directed   No driving     Increase activity slowly as tolerated    Complete by:  As directed      Lifting restrictions    Complete by:  As directed   No lifting            Medication List    TAKE these medications        CALCIUM + D PO  Take 1 tablet by mouth daily.     citalopram 40 MG tablet  Commonly known as:  CELEXA  Take 40 mg by mouth at bedtime.     clonazePAM 1 MG tablet  Commonly known as:  KLONOPIN  Take 1 mg by mouth at bedtime.     etodolac 400 MG tablet  Commonly known as:  LODINE  Take 400 mg by mouth 2 (two) times daily.     HYDROmorphone 2 MG tablet  Commonly known as:  DILAUDID  Take 1 tablet (2 mg total) by mouth every 4 (four) hours as needed for severe pain.     lisinopril 20 MG tablet  Commonly known as:  PRINIVIL,ZESTRIL  Take 20 mg by mouth at bedtime.     methocarbamol 500 MG tablet  Commonly known as:  ROBAXIN    Take 1 tablet (500 mg total) by mouth every 6 (six) hours as needed for muscle spasms.     pantoprazole 40 MG tablet  Commonly known as:  PROTONIX  Take 40 mg by mouth daily.     STELARA 90 MG/ML Sosy  Generic drug:  Ustekinumab  Inject into the skin every 3 (three) months.           Follow-up Information    Follow up with GIOFFRE,RONALD A, MD. Schedule an appointment as soon as possible for a visit in 2 weeks.   Specialty:  Orthopedic  Surgery   Contact information:   [address] [place] [address] [phone]       Signed: Ardeen Jourdain, PA-C Orthopaedic Surgery 11/15/2014, 11:41 AM

## 2019-04-02 ENCOUNTER — Ambulatory Visit: Payer: Self-pay | Admitting: Orthopedic Surgery

## 2019-04-09 ENCOUNTER — Ambulatory Visit: Payer: Self-pay | Admitting: Orthopedic Surgery

## 2019-04-16 ENCOUNTER — Ambulatory Visit (INDEPENDENT_AMBULATORY_CARE_PROVIDER_SITE_OTHER): Payer: Medicare HMO | Admitting: Orthopedic Surgery

## 2019-04-16 ENCOUNTER — Encounter: Payer: Self-pay | Admitting: Orthopedic Surgery

## 2019-04-16 ENCOUNTER — Ambulatory Visit (INDEPENDENT_AMBULATORY_CARE_PROVIDER_SITE_OTHER): Payer: Medicare HMO

## 2019-04-16 ENCOUNTER — Other Ambulatory Visit: Payer: Self-pay

## 2019-04-16 DIAGNOSIS — G8929 Other chronic pain: Secondary | ICD-10-CM

## 2019-04-16 DIAGNOSIS — M25512 Pain in left shoulder: Secondary | ICD-10-CM

## 2019-04-16 MED ORDER — PREDNISONE 5 MG (21) PO TBPK: ORAL_TABLET | ORAL | 0 refills | Status: AC

## 2019-04-21 ENCOUNTER — Encounter: Payer: Self-pay | Admitting: Orthopedic Surgery

## 2019-04-21 ENCOUNTER — Ambulatory Visit: Payer: Self-pay | Admitting: Orthopaedic Surgery

## 2019-04-21 NOTE — Progress Notes (Signed)
Office Visit Note   Patient: Rhonda Moreno           Date of Birth: Apr 29, 1961           MRN: [id number] Visit Date: 04/16/2019 Requested by: Moreno, Rhonda Sousa, MD No address on file PCP: Moreno, Rhonda Sousa, MD  Subjective: Chief Complaint  Patient presents with  . Left Shoulder - Pain    HPI: Rhonda Moreno is a patient with left arm pain.  Pains been going on since 2010 and is getting worse.  She is right-hand dominant.  Reports pain radiating into the biceps with some decreased range of motion.  Reports occasional numbness and tingling in the hand.  Denies any history of injury.  Also describes pain in the deltoid region.  Currently she is not working.  Denies any popping or mechanical symptoms.  She does have a known history of bilateral carpal tunnel syndrome.  Denies any neck pain.  Has never had an injection.  Takes Goody powders for the problem.  Takes Percocet for her back.              ROS: All systems reviewed are negative as they relate to the chief complaint within the history of present illness.  Patient denies  fevers or chills.   Assessment & Plan: Visit Diagnoses:  1. Chronic left shoulder pain     Plan: Impression is left shoulder pain.  Radiographs unremarkable.  Might be bursitis or small cuff tear.  Does not look like frozen shoulder.  Does not really look radicular in nature but that is a possibility as well.  We discussed injection today but she wants to try Medrol Dosepak.  We will try that and consider injection and/or further imaging if her symptoms persist.  I will see her back in 4 to 6 weeks if she is not improved.  Follow-Up Instructions: Return if symptoms worsen or fail to improve.   Orders:  Orders Placed This Encounter  Procedures  . XR Shoulder Left   Meds ordered this encounter  Medications  . predniSONE (STERAPRED UNI-PAK 21 TAB) 5 MG (21) TBPK tablet    Sig: Take dosepak as directed    Dispense:  21 tablet    Refill:  0      Procedures:  No procedures performed   Clinical Data: No additional findings.  Objective: Vital Signs: There were no vitals taken for this visit.  Physical Exam:   Constitutional: Patient appears well-developed HEENT:  Head: Normocephalic Eyes:EOM are normal Neck: Normal range of motion Cardiovascular: Normal rate Pulmonary/chest: Effort normal Neurologic: Patient is alert Skin: Skin is warm Psychiatric: Patient has normal mood and affect    Ortho Exam: Ortho exam demonstrates good range of motion of the cervical spine.  Left arm demonstrates full active and passive range of motion with good rotator cuff strength to infraspinatus supraspinatus and subscap muscle testing.  AC joint nontender.  No other masses lymphadenopathy or skin changes noted in that shoulder girdle region.  No coarse grinding or crepitus with passive range of motion of the shoulder.  Specialty Comments:  No specialty comments available.  Imaging: No results found.   PMFS History: Patient Active Problem List   Diagnosis Date Noted  . Spinal stenosis, lumbar region, with neurogenic claudication 11/10/2014   Past Medical History:  Diagnosis Date  . Anxiety   . Arthritis   . Complication of anesthesia    NAUSEA  . Depression   . Difficulty sleeping   . Fibromyalgia   .  GERD (gastroesophageal reflux disease)   . Hypertension   . Leaky heart valve    "2 VALVES" NO TREATMENT NEEDED AT THIS TIME  - Followed by [facility] - notes in chart  . Numbness in both legs    worse in rt leg  . Osteopenia   . PONV (postoperative nausea and vomiting)    NAUSEA  . Psoriasis   . Psoriatic arthritis (HCC)   . Rheumatoid arthritis (HCC)   . Shortness of breath dyspnea    OCCASIONALLLY WITH EXERTION  . Spinal stenosis at L4-L5 level   . Thyroid nodule   . Urgency of urination     History reviewed. No pertinent family history.  Past Surgical History:  Procedure Laterality Date  . ABDOMINAL HYSTERECTOMY  2010   . CESAREAN SECTION     X2  . CHOLECYSTECTOMY    . DECOMPRESSIVE LUMBAR LAMINECTOMY LEVEL 1 Right 11/10/2014   Procedure: CENTRAL DECOMPRESSIVE LUMBAR LAMINECTOMY LEVEL 1 AND MICRODEISCECTOMY L4-5 ON RIGHT WITH EXCISION OF SYNOVIAL CYST OF L4-5 ON RIGHT;  Surgeon: Rhonda Cones, MD;  Location: [facility];  Service: Orthopedics;  Laterality: Right;  . FOOT SURGERY     LEFT   Social History   Occupational History  . Not on file  Tobacco Use  . Smoking status: Former Smoker    Last attempt to quit: 11/02/2009    Years since quitting: 9.4  Substance and Sexual Activity  . Alcohol use: No  . Drug use: No  . Sexual activity: Not on file

## 2019-05-06 ENCOUNTER — Other Ambulatory Visit: Payer: Self-pay

## 2019-05-06 ENCOUNTER — Encounter: Payer: Self-pay | Admitting: Orthopedic Surgery

## 2019-05-06 ENCOUNTER — Ambulatory Visit (INDEPENDENT_AMBULATORY_CARE_PROVIDER_SITE_OTHER): Payer: Medicare HMO | Admitting: Orthopedic Surgery

## 2019-05-06 VITALS — Ht 62.0 in | Wt 233.0 lb

## 2019-05-06 DIAGNOSIS — G8929 Other chronic pain: Secondary | ICD-10-CM | POA: Diagnosis not present

## 2019-05-06 DIAGNOSIS — M25512 Pain in left shoulder: Secondary | ICD-10-CM

## 2019-05-06 DIAGNOSIS — M542 Cervicalgia: Secondary | ICD-10-CM | POA: Diagnosis not present

## 2019-05-07 ENCOUNTER — Encounter: Payer: Self-pay | Admitting: Orthopedic Surgery

## 2019-05-07 NOTE — Progress Notes (Signed)
Office Visit Note   Patient: Rhonda Moreno           Date of Birth: 01-Oct-1961           MRN: [id number] Visit Date: 05/06/2019 Requested by: Tegeler, Smith Mince, MD No address on file PCP: Tegeler, Smith Mince, MD  Subjective: Chief Complaint  Patient presents with   Left Arm - Pain, Follow-up    HPI: Rhonda Moreno is a patient with left arm pain.  She reports pain in the forearm as well as pain in the shoulder region.  She does have difficulty with overhead motion.  Did not have any help with Medrol Dosepak.  She is on oxycodone for low back pain.  Pain does radiate in the anterior and posterior aspect of the arm and into the forehand and forearm region.  She reports some mild neck pain.              ROS: All systems reviewed are negative as they relate to the chief complaint within the history of present illness.  Patient denies  fevers or chills.   Assessment & Plan: Visit Diagnoses:  1. Chronic left shoulder pain   2. Cervicalgia     Plan: Impression is left arm pain primarily localizing around the shoulder.  I would say this is most definitely shoulder related or not for the forearm pain.  The forearm pain that she has on the left-hand side is a deep bone-like pain which I have seen with radiculopathy.  She does have some component of radiation in the posterior aspect of the arm and scapula.  She also has fairly compelling shoulder symptoms with possible rotator cuff tear.  Symptoms have been going on for months and she has failed conservative treatment.  I recommend MRI of the C-spine to evaluate left-sided radiculopathy.  Visualized portion of the cervical spine on the plain radiographs show really minimal arthritic changes and no significant facet arthritis.  It is possible she may have disc pathology particularly with her history of being on immunosuppressants.  Also needs MRI arthrogram left shoulder to evaluate for rotator cuff tear.  She may have at least a small cuff tear  trouble.  I will see her back after those studies.  Follow-Up Instructions: Return for after MRI.   Orders:  Orders Placed This Encounter  Procedures   MR Cervical Spine w/o contrast   MR Shoulder Left w/ contrast   Arthrogram   No orders of the defined types were placed in this encounter.     Procedures: No procedures performed   Clinical Data: No additional findings.  Objective: Vital Signs: Ht 5\' 2"  (1.575 m)    Wt 233 lb (105.7 kg)    BMI 42.62 kg/m   Physical Exam:   Constitutional: Patient appears well-developed HEENT:  Head: Normocephalic Eyes:EOM are normal Neck: Normal range of motion Cardiovascular: Normal rate Pulmonary/chest: Effort normal Neurologic: Patient is alert Skin: Skin is warm Psychiatric: Patient has normal mood and affect    Ortho Exam: Ortho exam demonstrates reasonable cervical spine range of motion.  She has 5 out of 5 grip EPL FPL interosseous wrist flexion extension bicep tricep deltoid strength.  Radial pulse palpable.  She does have a little coarseness with internal X rotation on the left at 90 degrees of abduction and above.  No other masses lymphadenopathy or skin changes noted in that shoulder girdle region.  No discrete AC joint tenderness.  O'Brien's testing negative.  No restriction of passive external rotation.  Reflexes symmetric bilateral biceps and triceps.  Specialty Comments:  No specialty comments available.  Imaging: No results found.   PMFS History: Patient Active Problem List   Diagnosis Date Noted   Spinal stenosis, lumbar region, with neurogenic claudication 11/10/2014   Past Medical History:  Diagnosis Date   Anxiety    Arthritis    Complication of anesthesia    NAUSEA   Depression    Difficulty sleeping    Fibromyalgia    GERD (gastroesophageal reflux disease)    Hypertension    Leaky heart valve    "2 VALVES" NO TREATMENT NEEDED AT THIS TIME  - Followed by [facility] - notes in  chart   Numbness in both legs    worse in rt leg   Osteopenia    PONV (postoperative nausea and vomiting)    NAUSEA   Psoriasis    Psoriatic arthritis ([place])    Rheumatoid arthritis ([place])    Shortness of breath dyspnea    OCCASIONALLLY WITH EXERTION   Spinal stenosis at L4-L5 level    Thyroid nodule    Urgency of urination     History reviewed. No pertinent family history.  Past Surgical History:  Procedure Laterality Date   ABDOMINAL HYSTERECTOMY  2010   CESAREAN SECTION     X2   CHOLECYSTECTOMY     DECOMPRESSIVE LUMBAR LAMINECTOMY LEVEL 1 Right 11/10/2014   Procedure: CENTRAL DECOMPRESSIVE LUMBAR LAMINECTOMY LEVEL 1 AND MICRODEISCECTOMY L4-5 ON RIGHT WITH EXCISION OF SYNOVIAL CYST OF L4-5 ON RIGHT;  Surgeon: Tobi Bastos, MD;  Location: [facility];  Service: Orthopedics;  Laterality: Right;   FOOT SURGERY     LEFT   Social History   Occupational History   Not on file  Tobacco Use   Smoking status: Former Smoker    Quit date: 11/02/2009    Years since quitting: 9.5   Smokeless tobacco: Never Used  Substance and Sexual Activity   Alcohol use: No   Drug use: No   Sexual activity: Not on file

## 2019-05-08 ENCOUNTER — Telehealth: Payer: Self-pay | Admitting: Orthopedic Surgery

## 2019-05-08 NOTE — Telephone Encounter (Signed)
Patient called stated that she talked w/[facility] and order for left shoulder but she needs upper arm included.  [phone]

## 2019-05-08 NOTE — Telephone Encounter (Signed)
IC patient, no answer. LMVM and advised that neck and shoulder scans were ordered and this should include her upper arm. Advised she could call back with any other questions.

## 2019-06-04 ENCOUNTER — Ambulatory Visit
Admission: RE | Admit: 2019-06-04 | Discharge: 2019-06-04 | Disposition: A | Discharge status: Home / Self Care | Payer: Medicare HMO | Source: Ambulatory Visit | Attending: Orthopedic Surgery | Admitting: Orthopedic Surgery

## 2019-06-04 ENCOUNTER — Other Ambulatory Visit: Payer: Self-pay

## 2019-06-04 DIAGNOSIS — M542 Cervicalgia: Secondary | ICD-10-CM

## 2019-06-04 DIAGNOSIS — G8929 Other chronic pain: Secondary | ICD-10-CM

## 2019-06-04 IMAGING — MR MRI CERVICAL SPINE WITHOUT CONTRAST
4 of 5 series · 28 of 48 positions shown · non-contrast
Comparison: None.

CLINICAL DATA: Cervicalgia.  Left-sided radicular pain.

EXAM:
MRI CERVICAL SPINE WITHOUT CONTRAST
TECHNIQUE: Multiplanar, multisequence MR imaging of the cervical spine was
performed. No intravenous contrast was administered.

[Series 3: tir sag · sagittal · 3.0mm · 0.41mm/px · 6 of 13 slices shown]
[im 1/13]
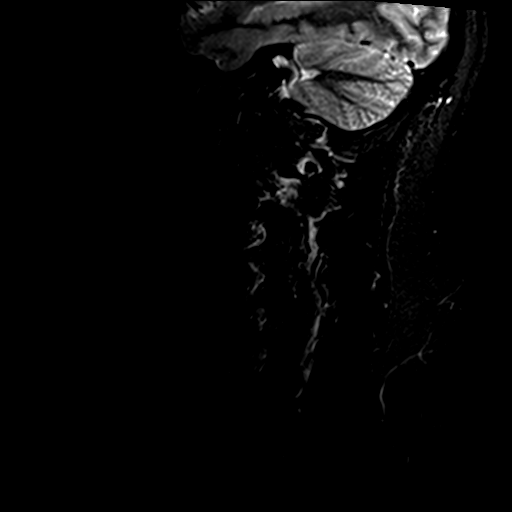
[im 3/13]
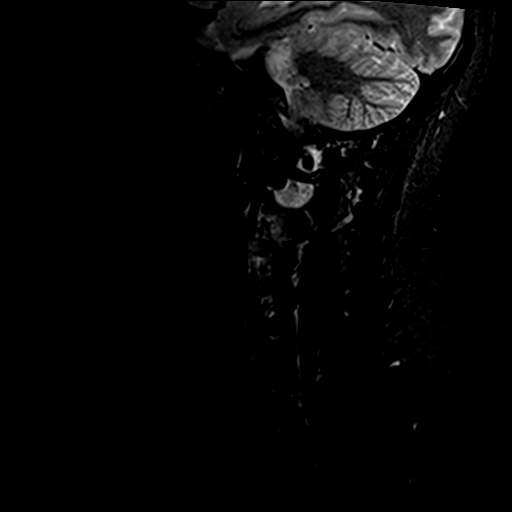
[im 5/13]
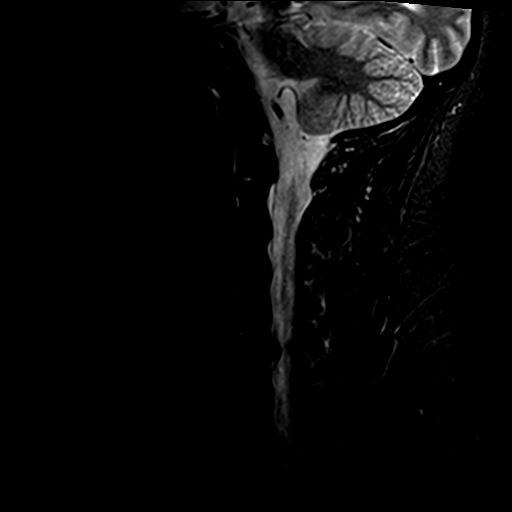
[im 8/13]
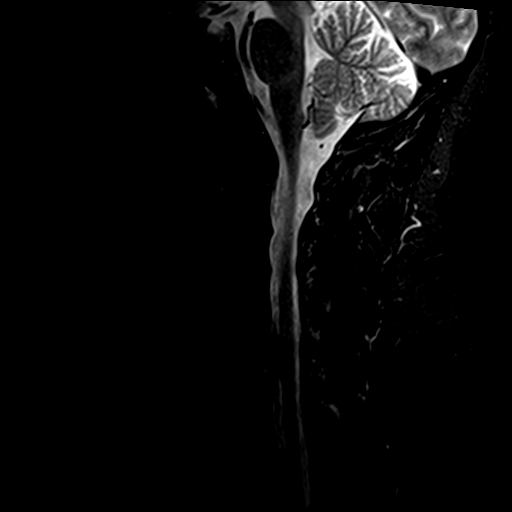
[im 10/13]
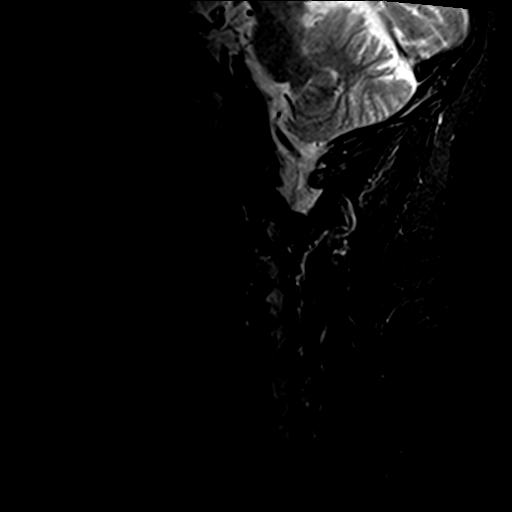
[im 13/13]
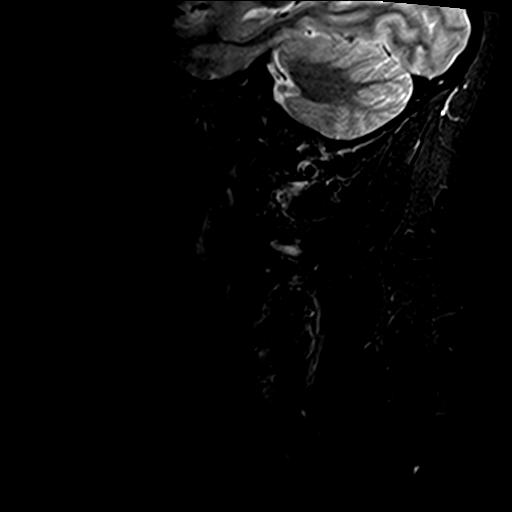

[Series 4: T1 · sagittal · 3.0mm · 0.41mm/px · 7 of 13 slices shown]
[im 1/13]
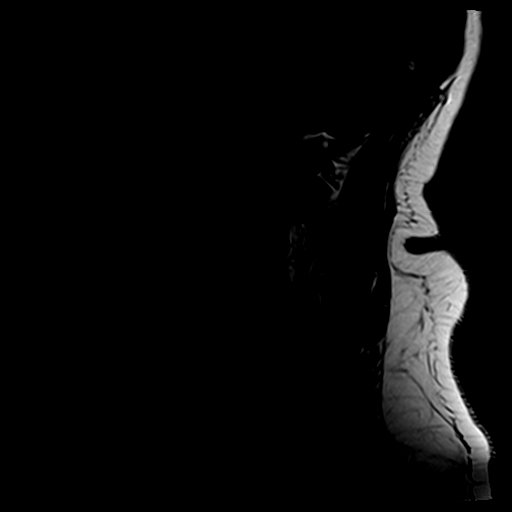
[im 3/13]
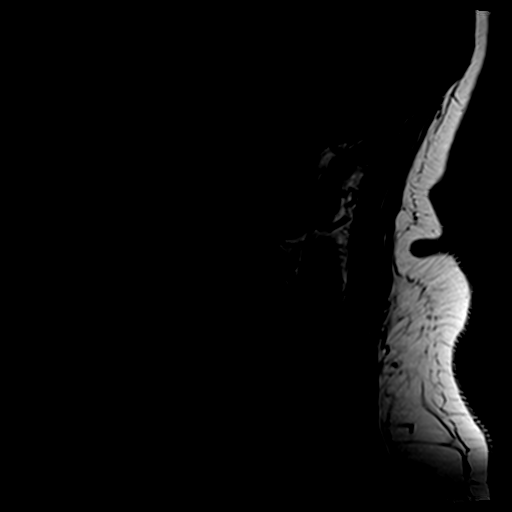
[im 5/13]
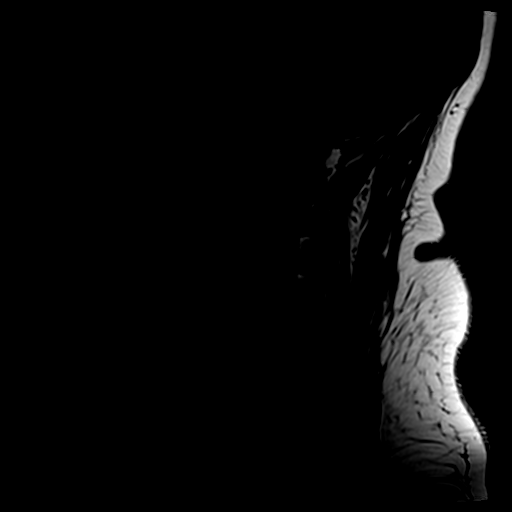
[im 7/13]
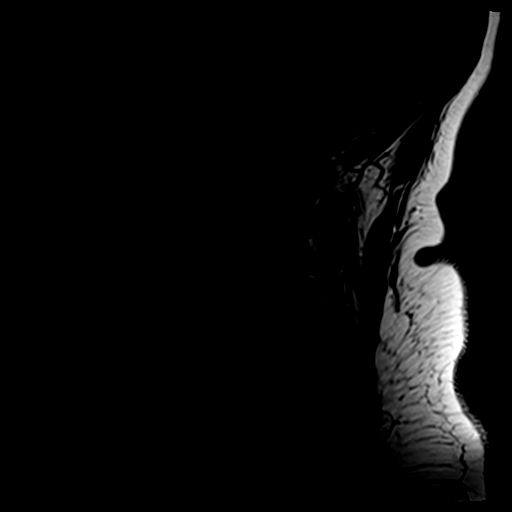
[im 9/13]
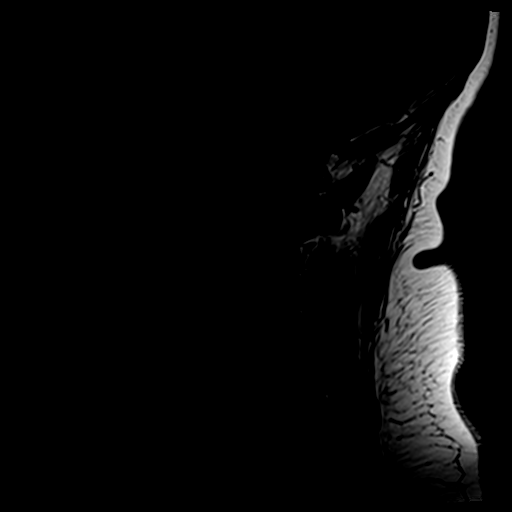
[im 11/13]
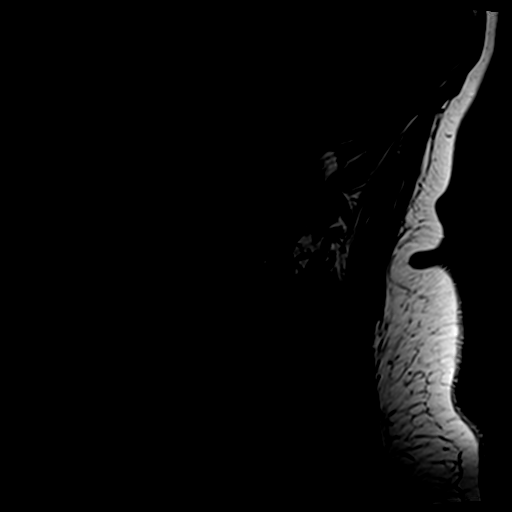
[im 13/13]
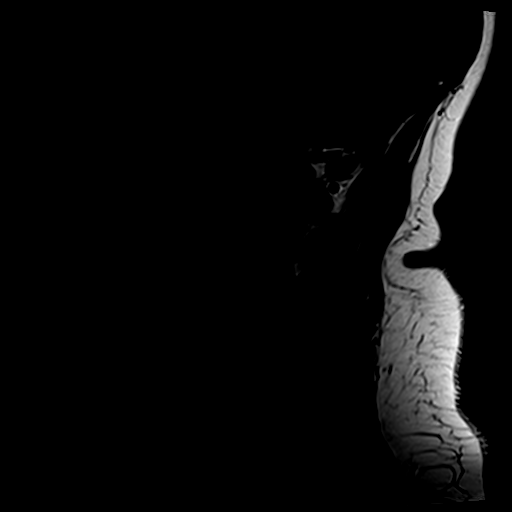

[Series 5: T2 · sagittal · 3.0mm · 0.66mm/px · 7 of 13 slices shown (1 of 2)]
[im 1/13]
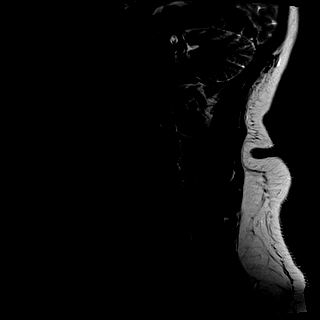
[im 3/13]
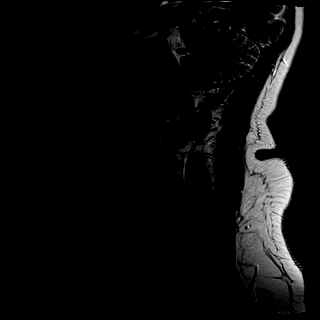
[im 5/13]
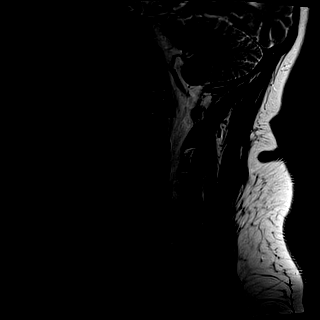
[im 7/13]
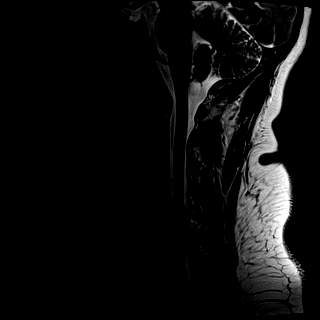
[im 9/13]
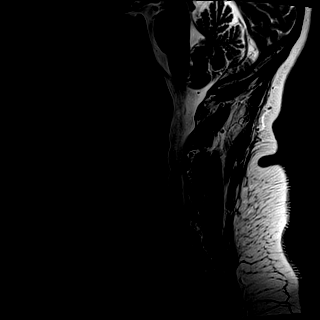
[im 11/13]
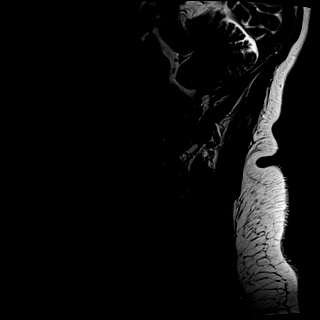
[im 13/13]
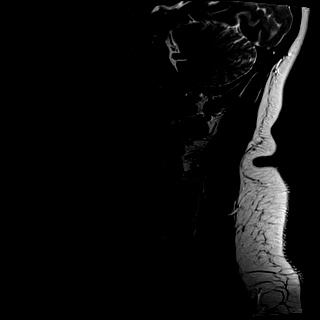

[Series 7: T2 · axial · 3.0mm · 0.70mm/px · z∈[-51,+50]mm · 8 of 28 slices shown (2 of 2)]
[im 1/28]
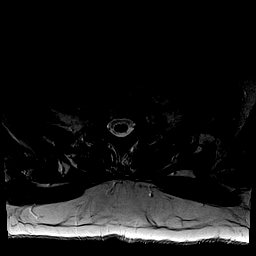
[im 5/28]
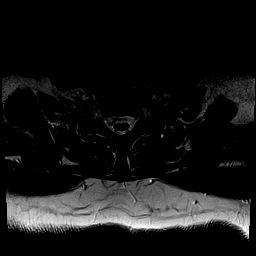
[im 9/28]
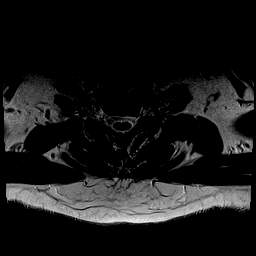
[im 13/28]
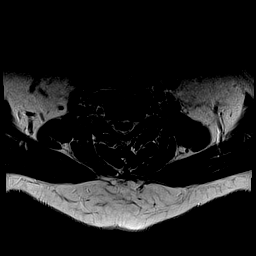
[im 15/28]
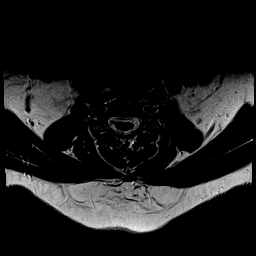
[im 19/28]
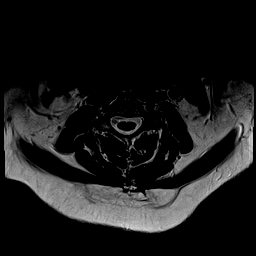
[im 23/28]
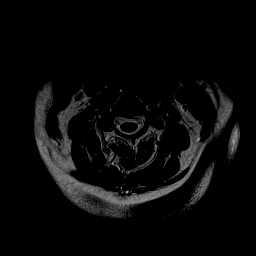
[im 28/28]
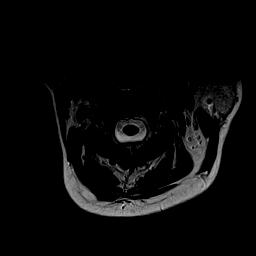

[28 of 48 positions shown; findings below may reference images not displayed]

FINDINGS: Alignment: Cervical spine straightening.  No listhesis.

Vertebrae: No fracture, suspicious osseous lesion, or significant
marrow edema.

Cord: Normal signal and morphology.

Posterior Fossa, vertebral arteries, paraspinal tissues:
Unremarkable.

Disc levels:

C2-3: Mild disc bulging, asymmetric left uncovertebral spurring, and
mild facet arthrosis result in mild left neural foraminal stenosis
without spinal stenosis.

C3-4: Mild disc bulging and asymmetric left uncovertebral spurring
result in mild left neural foraminal stenosis without spinal
stenosis.

C4-5: Negative.

C5-6: Moderate disc space narrowing. Disc bulging, a broad left
paracentral disc protrusion, and left greater than right
uncovertebral spurring result in mild spinal stenosis and
mild-to-moderate right and moderate to severe left neural foraminal
stenosis with potential left C6 nerve root impingement.

C6-7: Mild left facet arthrosis without disc herniation or stenosis.

C7-T1: Mild facet arthrosis without disc herniation or stenosis.

T1-2: Only imaged sagittally. A right paracentral to right foraminal
disc protrusion result in moderate right neural foraminal stenosis
with potential right T1 nerve root impingement. No spinal stenosis.
IMPRESSION: 1. Cervical disc degeneration greatest at C5-6 where there is mild
spinal stenosis and moderate to severe left neural foraminal
stenosis with potential left C6 nerve root impingement.
2. Mild left neural foraminal stenosis at C2-3 and C3-4.
3. Moderate right neural foraminal stenosis at T1-2.

## 2019-06-04 IMAGING — MR MRI OF THE LEFT SHOULDER WITH CONTRAST
6 series · 40 of 40 positions shown · IV contrast (agent unspecified)
Comparison: Left shoulder x-ray [DATE]

CLINICAL DATA: Left shoulder pain

EXAM:
MR ARTHROGRAM OF THE left SHOULDER
TECHNIQUE: Multiplanar, multisequence MR imaging of the left shoulder was
performed following the administration of intra-articular contrast.
CONTRAST:  See Injection Documentation.

[Series 3: T1 fat-sat · axial · 4.0mm · 0.27mm/px · z∈[-67,+14]mm · 8 of 18 slices shown (1 of 4)]
[im 1/18]
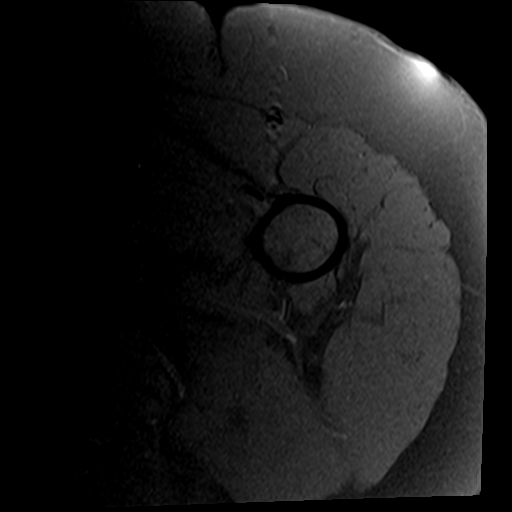
[im 3/18]
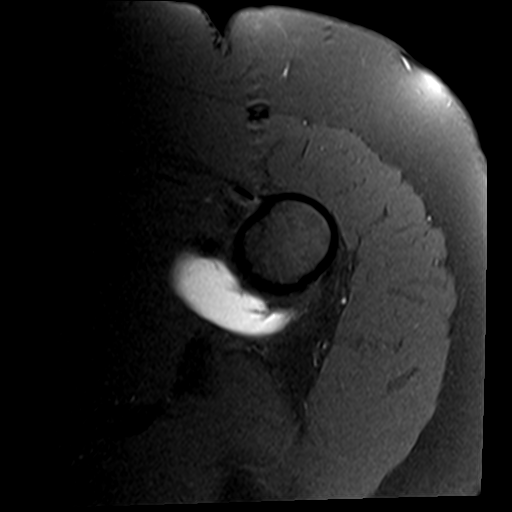
[im 5/18]
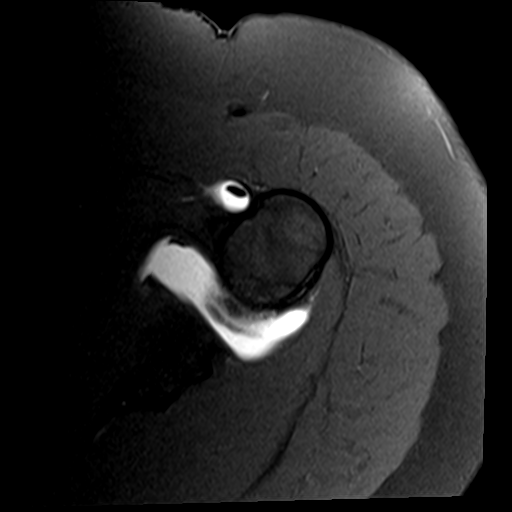
[im 8/18]
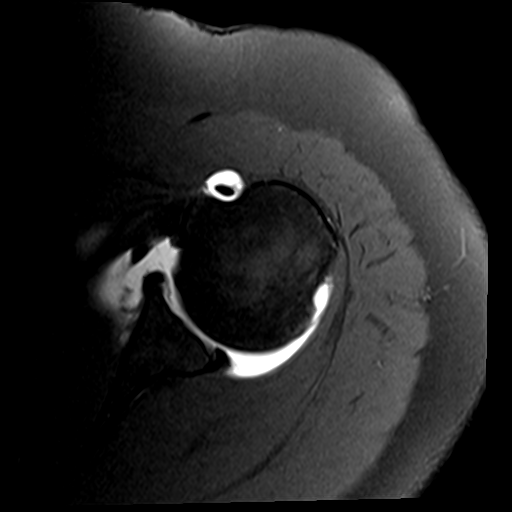
[im 10/18]
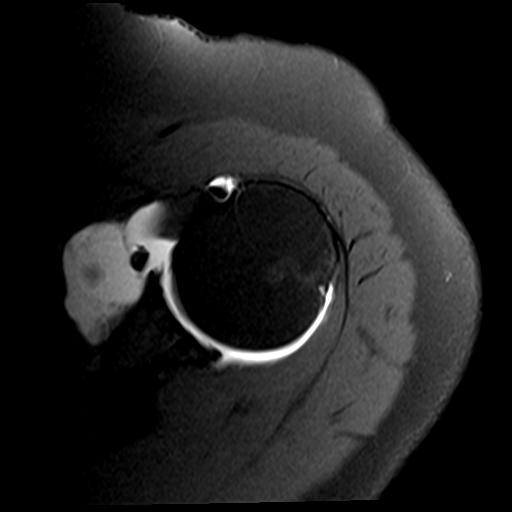
[im 13/18]
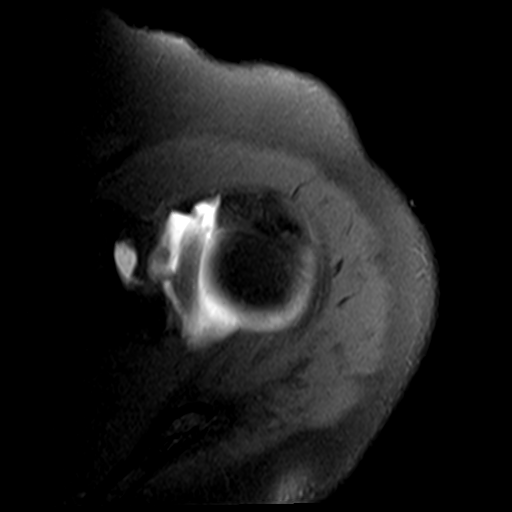
[im 15/18]
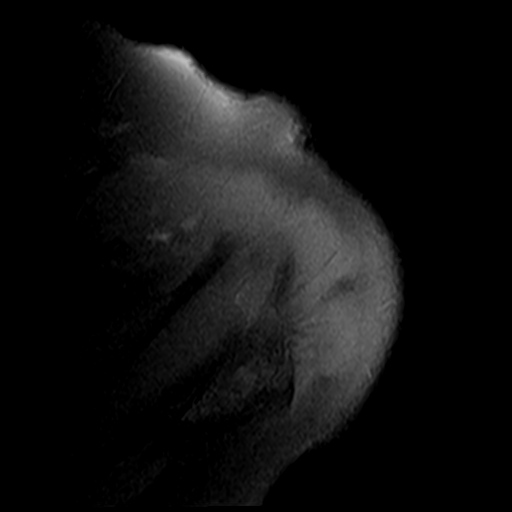
[im 18/18]
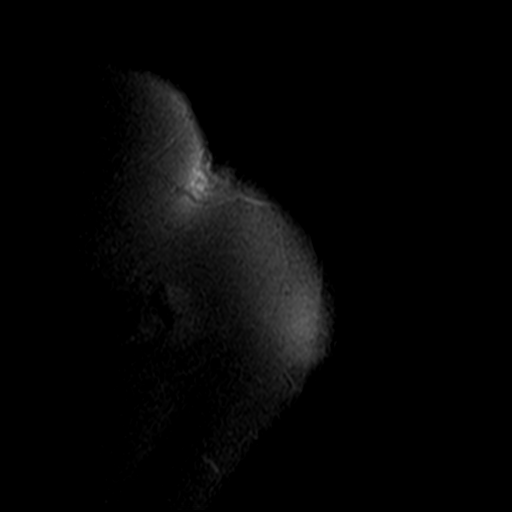

[Series 4: T2 fat-sat · oblique · 4.0mm · 0.55mm/px · 8 of 18 slices shown (1 of 2)]
[im 1/18]
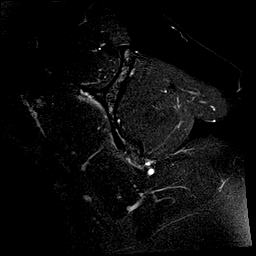
[im 3/18]
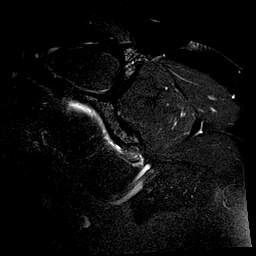
[im 5/18]
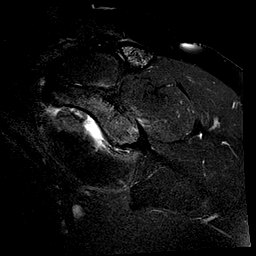
[im 8/18]
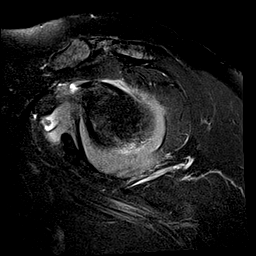
[im 10/18]
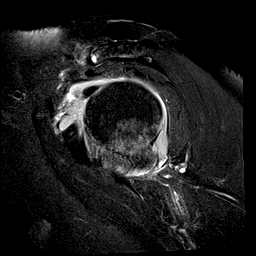
[im 13/18]
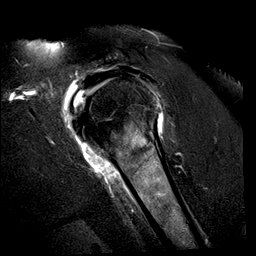
[im 15/18]
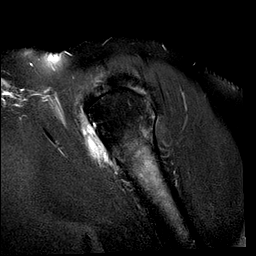
[im 18/18]
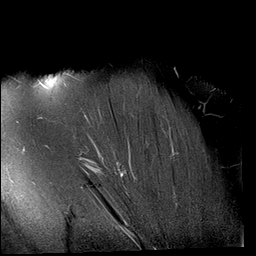

[Series 5: T1 fat-sat · oblique · 4.0mm · 0.55mm/px · 6 of 16 slices shown (2 of 4)]
[im 1/16]
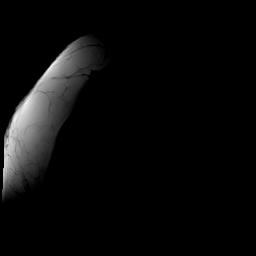
[im 4/16]
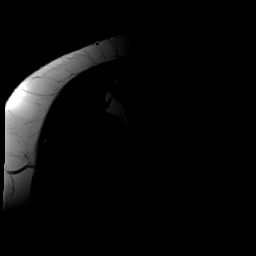
[im 7/16]
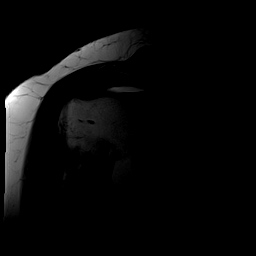
[im 10/16]
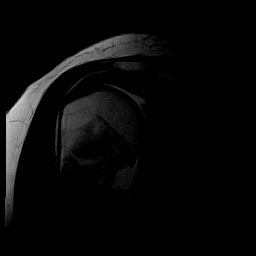
[im 13/16]
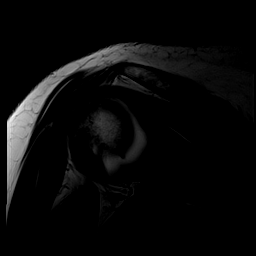
[im 16/16]
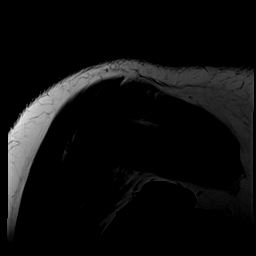

[Series 6: T1 fat-sat · oblique · 4.0mm · 0.55mm/px · 6 of 16 slices shown (3 of 4)]
[im 1/16]
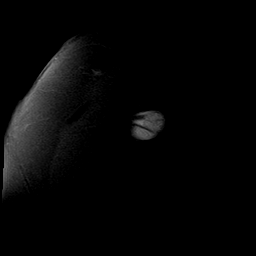
[im 4/16]
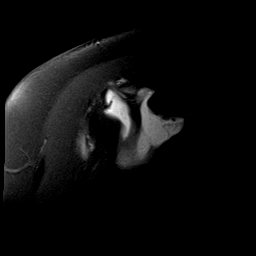
[im 7/16]
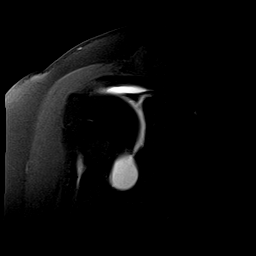
[im 10/16]
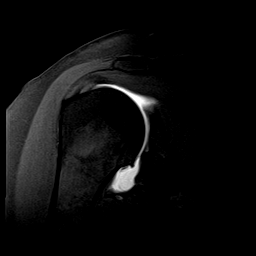
[im 13/16]
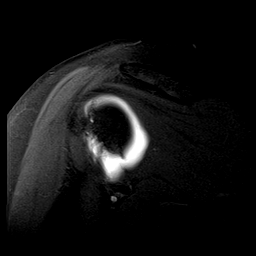
[im 16/16]
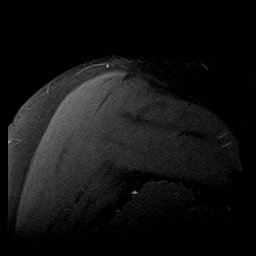

[Series 7: T2 fat-sat · oblique · 4.0mm · 0.55mm/px · 6 of 16 slices shown (2 of 2)]
[im 1/16]
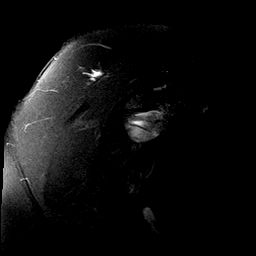
[im 4/16]
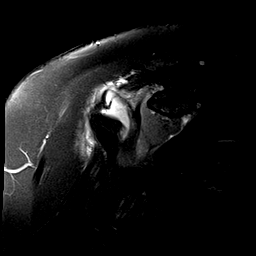
[im 7/16]
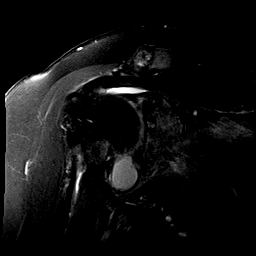
[im 10/16]
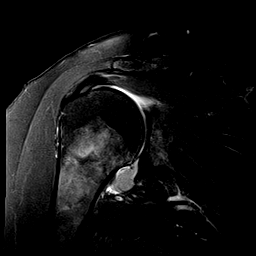
[im 13/16]
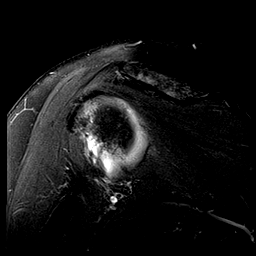
[im 16/16]
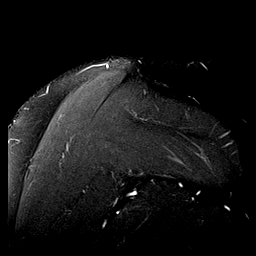

[Series 10: T1 fat-sat · oblique · 4.0mm · 0.59mm/px · 6 of 16 slices shown (4 of 4)]
[im 1/16]
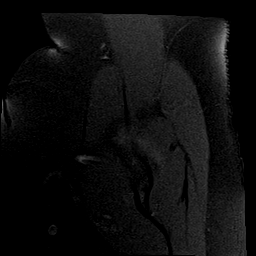
[im 4/16]
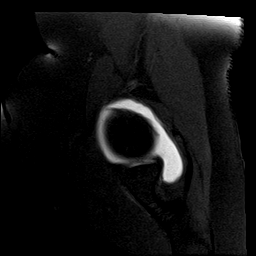
[im 7/16]
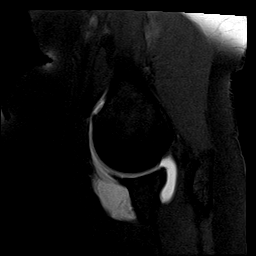
[im 10/16]
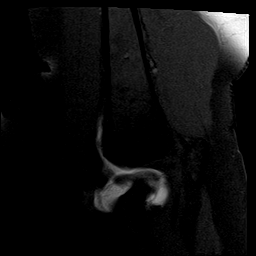
[im 13/16]
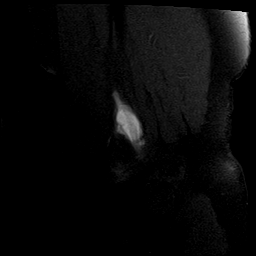
[im 16/16]
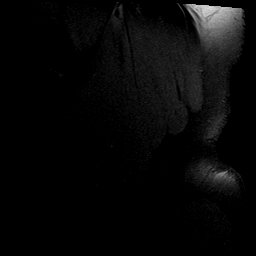

[40 of 40 positions shown; findings below may reference images not displayed]

FINDINGS: Rotator cuff: Partial thickness undersurface tear involving the
anterior most fibers of the distal supraspinatus tendon (series 7,
image 6). Supraspinatus and infraspinatus tendinosis. Mild
subscapularis tendinosis without discrete tear. Teres minor intact.
No full thickness rotator cuff tear.

Muscles: Normal bulk and signal of the rotator cuff musculature.

Biceps long head: Mild intra-articular biceps tendinosis at the
level of the biceps anchor.

Acromioclavicular Joint: Mild AC joint arthropathy with reactive
subchondral marrow changes and capsular hypertrophy. Trace
subacromial-subdeltoid bursal edema. No gadolinium within the bursa.

Glenohumeral Joint: Cartilage thinning and surface irregularity most
prominently involving the central and inferior portions of the
glenoid where there are small partial thickness fissures.
Glenohumeral joint is well distended with injected contrast medium.

Labrum: Blunted morphology of the anterior-inferior labrum. The
superior and posterosuperior labrum is somewhat diminutive. No
discrete or displaced labral tear.

Bones: No fracture. No marrow replacing lesion. Mild resorptive
changes at the rotator cuff footprint.
IMPRESSION: 1. Rotator cuff tendinosis with partial-thickness undersurface tear
of the anterior most fibers of the distal supraspinatus tendon.
2. Mild intra-articular biceps tendinosis.
3. Mild glenohumeral joint osteoarthritis with associated cartilage
and labral degeneration.

## 2019-06-04 IMAGING — XA FLUORO GUIDED NEEDLE PLACEMENT AND/OR ASPIRATION
2 series · 2 of 2 positions shown · IV contrast (multihance)
Comparison: none

CLINICAL DATA: Chronic left shoulder pain.

EXAM:
LEFT SHOULDER INJECTION UNDER FLUOROSCOPY
TECHNIQUE: An appropriate skin entrance site was determined. The site was
marked, prepped with Betadine, draped in the usual sterile fashion,
and infiltrated locally with 1% lidocaine. A 22 gauge spinal needle
was advanced to the superomedial margin of the humeral head under
intermittent fluoroscopy. 1 mL of 1% lidocaine injected easily. A
mixture of 0.1 mL of MultiHance, 10 mL of Isovue-M 200, and 10 mL of
sterile saline was then used to opacify the left shoulder capsule.
12 mL of this mixture were injected. No immediate complication.
FLUOROSCOPY TIME:  Fluoroscopy Time:  11 seconds
Radiation Exposure Index (if provided by the fluoroscopic device):
14.15 microGray*m^2
Number of Acquired Spot Images: 0

[Series 1: ortho standard · 1 of 1 slices shown (1 of 2)]
[im 1/1]
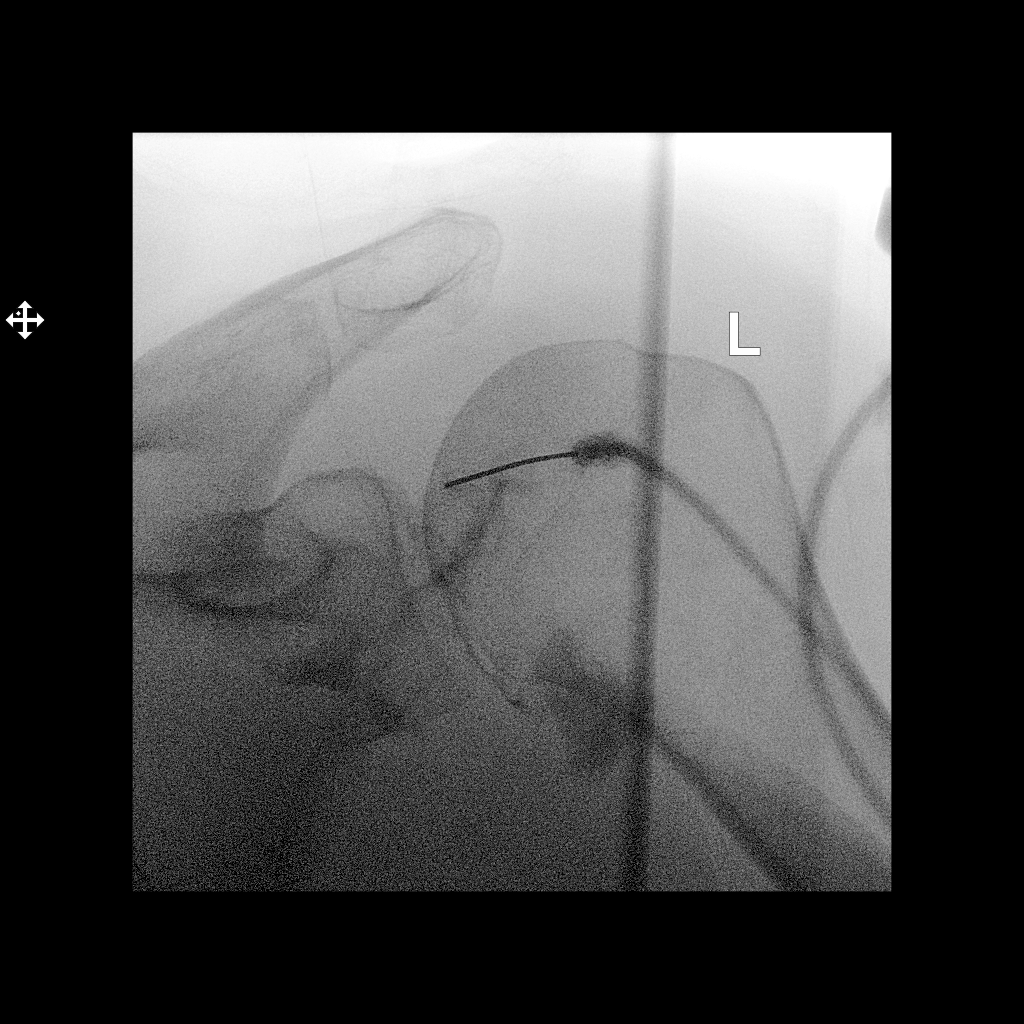

[Series 4: ortho standard · 1 of 1 slices shown (2 of 2)]
[im 1/1]
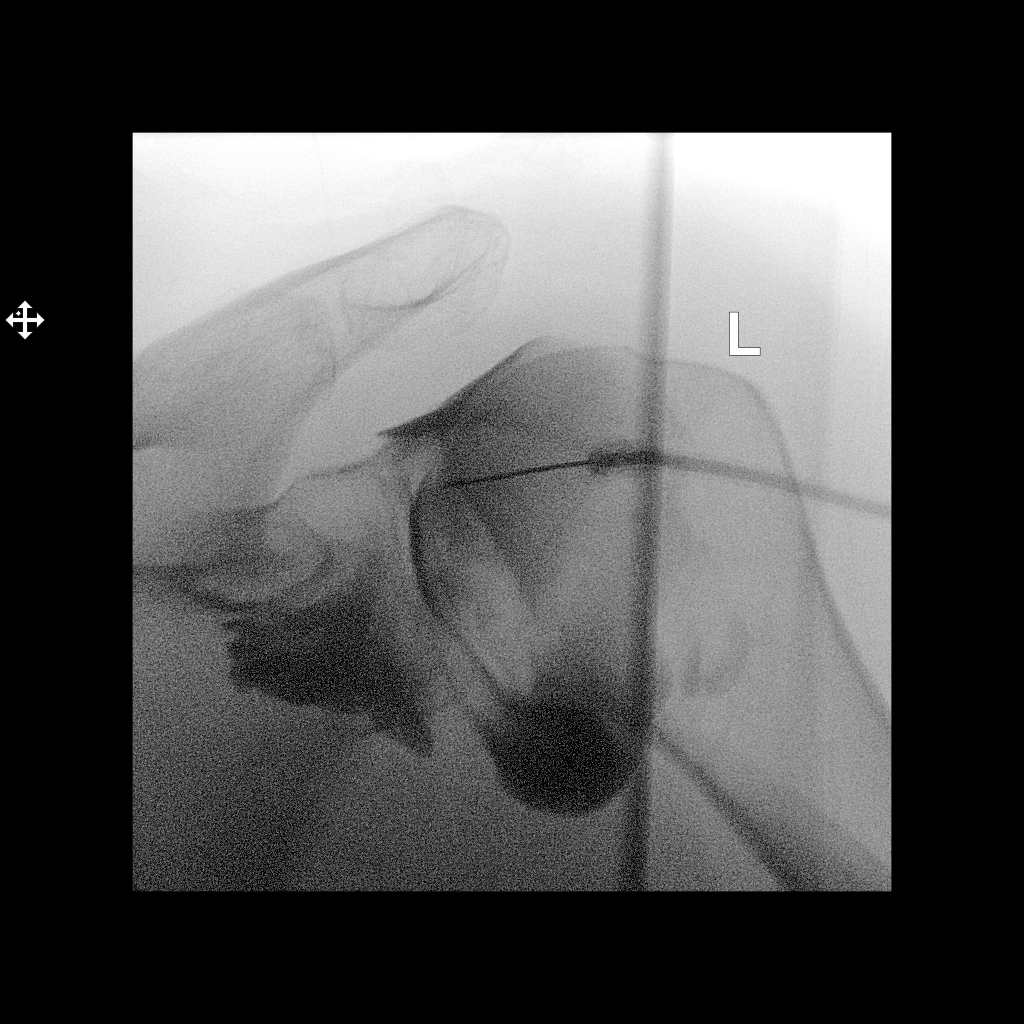

[2 of 2 positions shown; findings below may reference images not displayed]

IMPRESSION: Technically successful left shoulder injection for MRI.

## 2019-06-04 MED ORDER — IOPAMIDOL (ISOVUE-M 200) INJECTION 41%
13.0000 mL | Freq: Once | INTRAMUSCULAR | Status: AC
  Administered 2019-06-04: 15:00:00 13 mL via INTRA_ARTICULAR

## 2019-06-10 ENCOUNTER — Encounter: Payer: Self-pay | Admitting: Orthopedic Surgery

## 2019-06-10 ENCOUNTER — Ambulatory Visit (INDEPENDENT_AMBULATORY_CARE_PROVIDER_SITE_OTHER): Payer: Medicare HMO | Admitting: Orthopedic Surgery

## 2019-06-10 DIAGNOSIS — G8929 Other chronic pain: Secondary | ICD-10-CM

## 2019-06-10 DIAGNOSIS — M25512 Pain in left shoulder: Secondary | ICD-10-CM

## 2019-06-10 DIAGNOSIS — M542 Cervicalgia: Secondary | ICD-10-CM

## 2019-06-10 NOTE — Progress Notes (Signed)
Office Visit Note   Patient: Rhonda Moreno           Date of Birth: Aug 01, 1961           MRN: [id number] Visit Date: 06/10/2019 Requested by: Tegeler, Loma Sousa, MD No address on file PCP: Tegeler, Loma Sousa, MD  Subjective: Chief Complaint  Patient presents with  . Follow-up    HPI: Rhonda Moreno presents for continued left shoulder and arm pain.  Since have seen her she is had MRI scan of the neck which does show a C6-7 HNP on the left and that is reviewed with the patient.  She also has some tendinitis and possible irregularity of the superior labrum.  She is having some mechanical symptoms in the shoulder as well.  Steroids have not been helpful for her.  Recommendation is potentially for cervical spine ESI.              ROS: All systems reviewed are negative as they relate to the chief complaint within the history of present illness.  Patient denies  fevers or chills.   Assessment & Plan: Visit Diagnoses:  1. Cervicalgia   2. Chronic left shoulder pain     Plan: Impression is left shoulder tendinosis without rotator cuff pathology..  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  Steroids no help.  Plan at this time is cervical spine ESI when the patient deems that her symptoms are significant enough.  I do not think the shoulder pathology is significant enough for intervention at this time.  She does not have frozen shoulder or rotator cuff tear.  I will see her back as needed.  Follow-Up Instructions: Return if symptoms worsen or fail to improve.   Orders:  No orders of the defined types were placed in this encounter.  No orders of the defined types were placed in this encounter.     Procedures: No procedures performed   Clinical Data: No additional findings.  Objective: Vital Signs: There were no vitals taken for this visit.  Physical Exam:   Constitutional: Patient appears well-developed HEENT:  Head: Normocephalic Eyes:EOM are normal Neck: Normal range of  motion Cardiovascular: Normal rate Pulmonary/chest: Effort normal Neurologic: Patient is alert Skin: Skin is warm Psychiatric: Patient has normal mood and affect    Ortho Exam: Ortho exam demonstrates full active and passive range of motion of the cervical spine.  She has good range of motion of the shoulders well with a little bit of coarse grinding and crepitus with internal and external rotation at 90 degrees of abduction..  No weakness in that left arm.  O'Brien's testing positive on the left negative on the right.  Specialty Comments:  No specialty comments available.  Imaging: No results found.   PMFS History: Patient Active Problem List   Diagnosis Date Noted  . Spinal stenosis, lumbar region, with neurogenic claudication 11/10/2014   Past Medical History:  Diagnosis Date  . Anxiety   . Arthritis   . Complication of anesthesia    NAUSEA  . Depression   . Difficulty sleeping   . Fibromyalgia   . GERD (gastroesophageal reflux disease)   . Hypertension   . Leaky heart valve    "2 VALVES" NO TREATMENT NEEDED AT THIS TIME  - Followed by [facility] - notes in chart  . Numbness in both legs    worse in rt leg  . Osteopenia   . PONV (postoperative nausea and vomiting)    NAUSEA  . Psoriasis   .  Psoriatic arthritis (HCC)   . Rheumatoid arthritis (HCC)   . Shortness of breath dyspnea    OCCASIONALLLY WITH EXERTION  . Spinal stenosis at L4-L5 level   . Thyroid nodule   . Urgency of urination     History reviewed. No pertinent family history.  Past Surgical History:  Procedure Laterality Date  . ABDOMINAL HYSTERECTOMY  2010  . CESAREAN SECTION     X2  . CHOLECYSTECTOMY    . 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 FOOT SURGERY     LEFT   Social History   Occupational History  . Not on file  Tobacco Use  . Smoking status: Former Smoker    Quit date: 11/02/2009    Years since quitting: 9.6  . Smokeless tobacco: Never Used  Substance and Sexual Activity  . Alcohol use: No  . Drug use: No  . Sexual activity: Not on file

## 2019-07-24 ENCOUNTER — Other Ambulatory Visit: Payer: Self-pay | Admitting: Orthopaedic Surgery

## 2019-07-24 DIAGNOSIS — M4804 Spinal stenosis, thoracic region: Secondary | ICD-10-CM

## 2019-08-17 ENCOUNTER — Ambulatory Visit
Admission: RE | Admit: 2019-08-17 | Discharge: 2019-08-17 | Disposition: A | Discharge status: Home / Self Care | Payer: Medicare HMO | Source: Ambulatory Visit | Attending: Orthopaedic Surgery | Admitting: Orthopaedic Surgery

## 2019-08-17 DIAGNOSIS — M4804 Spinal stenosis, thoracic region: Secondary | ICD-10-CM

## 2019-08-17 IMAGING — MR MR THORACIC SPINE W/O CM
4 of 6 series · 13 of 48 positions shown · non-contrast
Comparison: None.

CLINICAL DATA: Pain between shoulder blades for 4 years.

EXAM:
MRI THORACIC SPINE WITHOUT CONTRAST
TECHNIQUE: Multiplanar, multisequence MR imaging of the thoracic spine was
performed. No intravenous contrast was administered.

[Series 4: T2 · sagittal · 4.0mm · 0.33mm/px · 4 of 15 slices shown (1 of 3)]
[im 1/15]
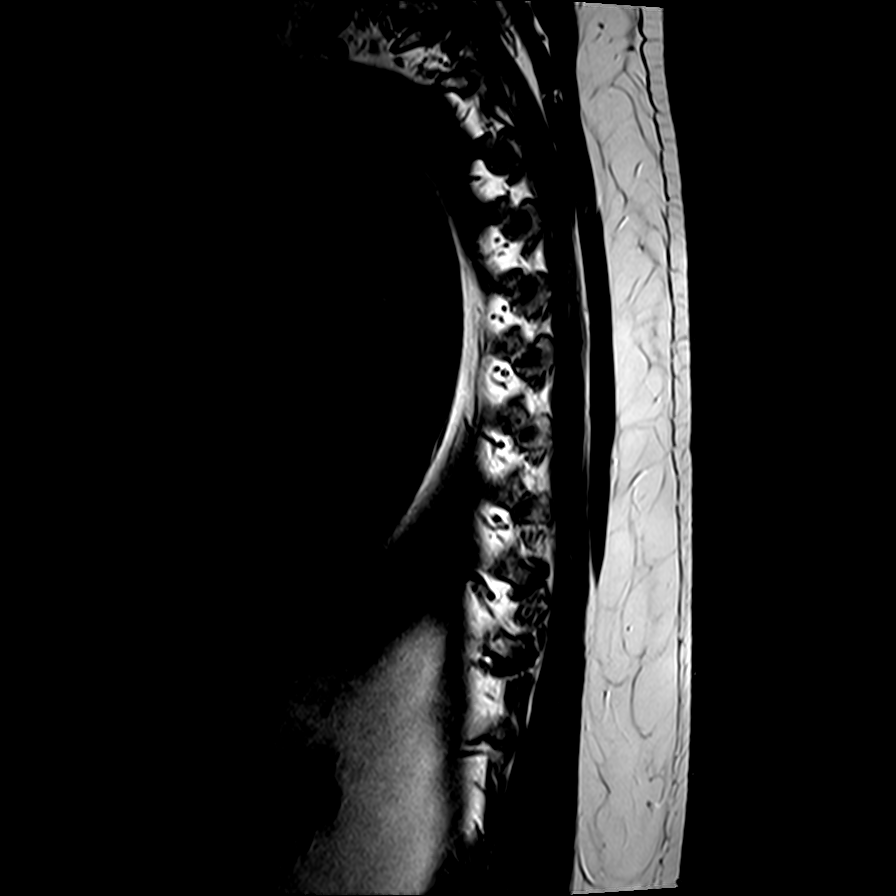
[im 3/15]
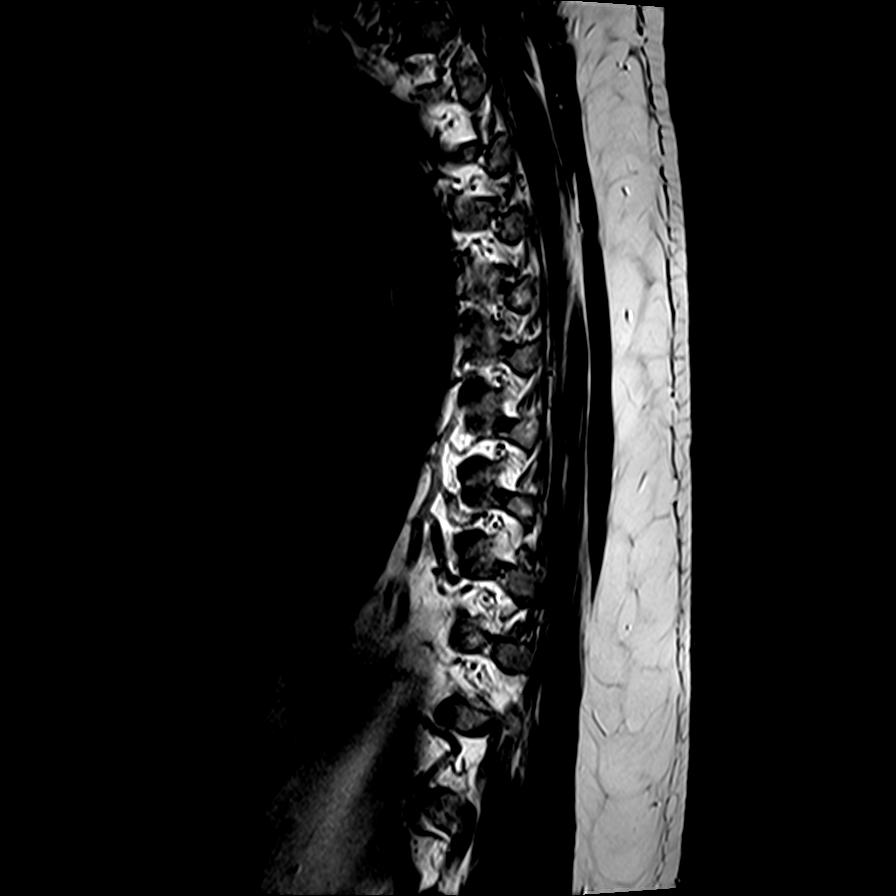
[im 9/15]
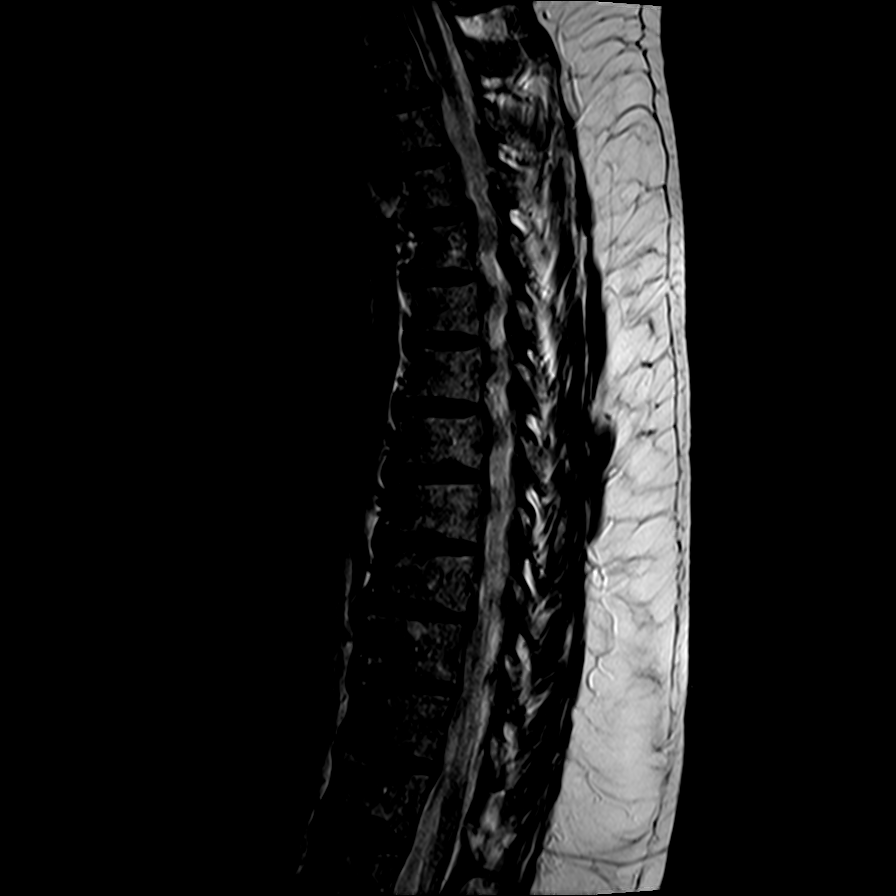
[im 15/15]
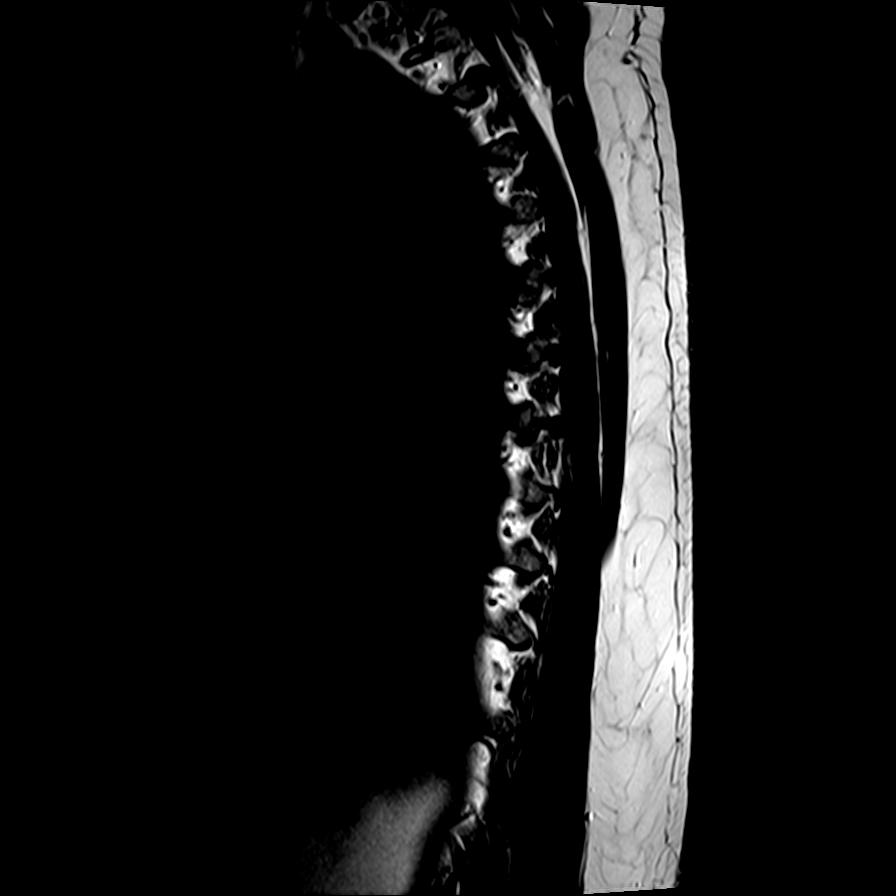

[Series 5: T1 · sagittal · 4.0mm · 0.67mm/px · 3 of 15 slices shown]
[im 3/15]
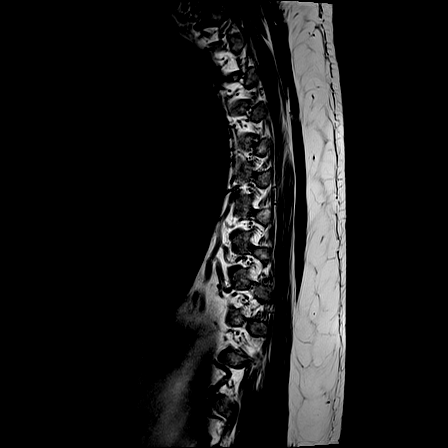
[im 9/15]
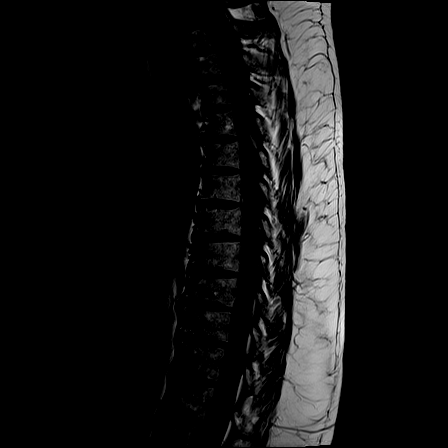
[im 15/15]
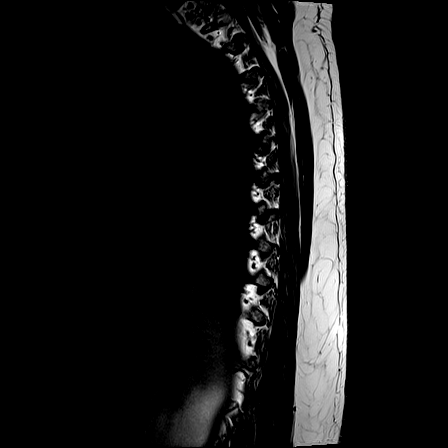

[Series 7: T2 · axial · 4.0mm · 0.39mm/px · z∈[-244,-90]mm · 3 of 39 slices shown (2 of 3)]
[im 6/39]
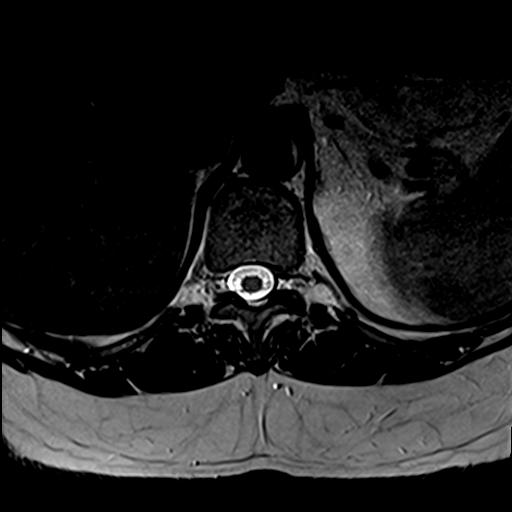
[im 21/39]
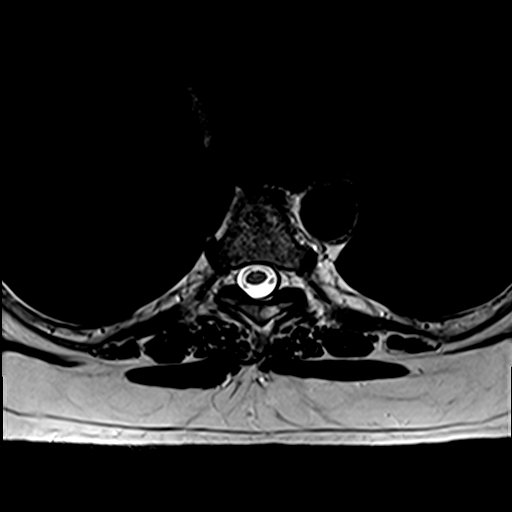
[im 33/39]
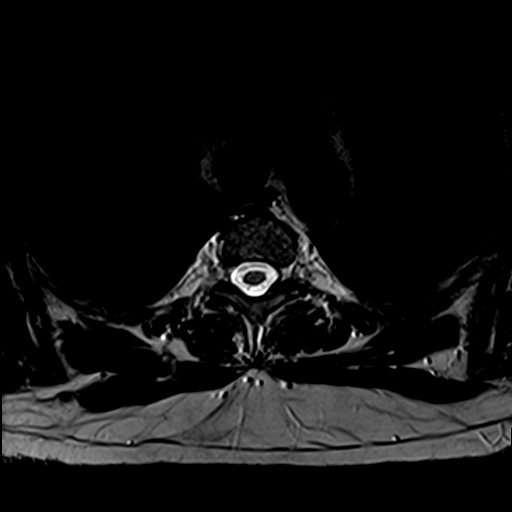

[Series 8: T2 · axial · 4.0mm · 0.39mm/px · z∈[-246,-87]mm · 3 of 39 slices shown (3 of 3)]
[im 6/39]
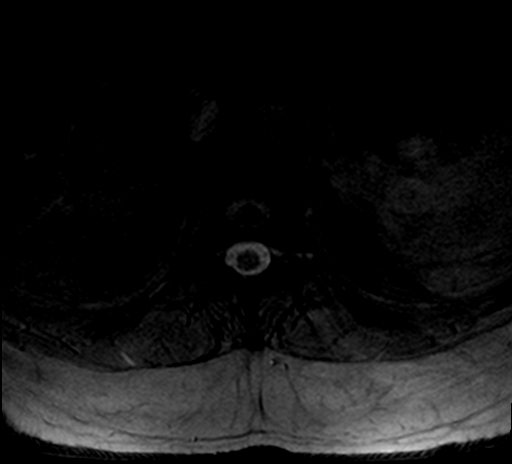
[im 21/39]
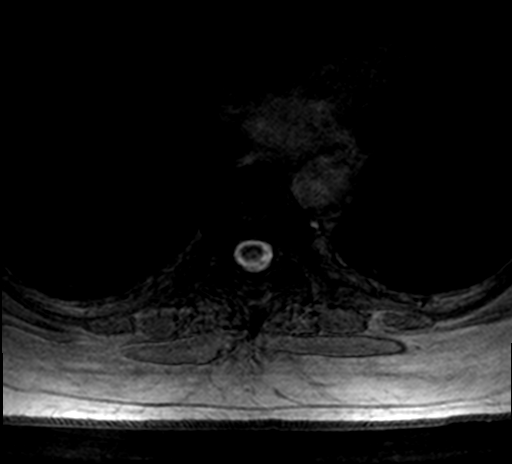
[im 33/39]
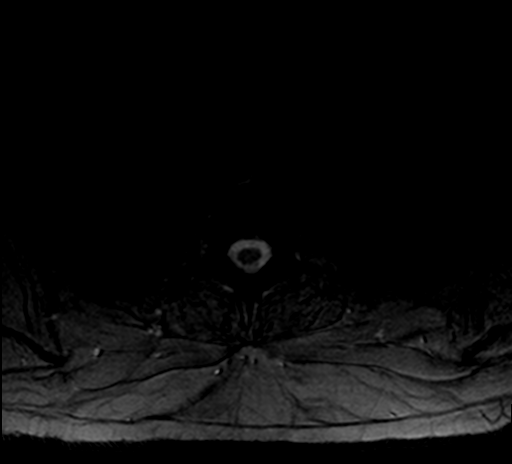

[13 of 48 positions shown; findings below may reference images not displayed]

FINDINGS: Alignment:  Normal

Vertebrae: Normal marrow signal.  No bone lesions or fractures.

Cord:  Normal cord signal intensity.  No cord lesions or syrinx.

Paraspinal and other soft tissues: No significant findings.

Disc levels:

Shallow right paracentral disc protrusion at T1-2. Minimal
impression on the thecal sac.

Shallow right paracentral disc protrusion at T3-4. Mild mass effect
on the thecal sac.

Central and left paracentral disc protrusion at T9-10 with minimal
impression on the thecal sac.
IMPRESSION: Small disc protrusions but no significant spinal or foraminal
stenosis in the thoracic spine.

Normal appearance of the thoracic spinal cord.
